# Patient Record
Sex: Female | Born: 2000 | Race: White | Hispanic: No | Marital: Single | Smoking: Never smoker
Health system: Southern US, Community
[De-identification: ages and names within clinical notes are randomized; demographics above are authoritative.]

## PROBLEM LIST (undated history)

## (undated) DIAGNOSIS — J45909 Unspecified asthma, uncomplicated: Secondary | ICD-10-CM

---
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
## 2018-02-16 DIAGNOSIS — J301 Allergic rhinitis due to pollen: Secondary | ICD-10-CM | POA: Diagnosis not present

## 2018-02-16 DIAGNOSIS — J3081 Allergic rhinitis due to animal (cat) (dog) hair and dander: Secondary | ICD-10-CM | POA: Diagnosis not present

## 2018-02-16 DIAGNOSIS — J3089 Other allergic rhinitis: Secondary | ICD-10-CM | POA: Diagnosis not present

## 2018-02-16 DIAGNOSIS — J4530 Mild persistent asthma, uncomplicated: Secondary | ICD-10-CM | POA: Diagnosis not present

## 2018-02-18 DIAGNOSIS — R350 Frequency of micturition: Secondary | ICD-10-CM | POA: Diagnosis not present

## 2018-07-15 DIAGNOSIS — Z23 Encounter for immunization: Secondary | ICD-10-CM | POA: Diagnosis not present

## 2018-07-28 DIAGNOSIS — J4530 Mild persistent asthma, uncomplicated: Secondary | ICD-10-CM | POA: Diagnosis not present

## 2018-07-28 DIAGNOSIS — J301 Allergic rhinitis due to pollen: Secondary | ICD-10-CM | POA: Diagnosis not present

## 2018-07-28 DIAGNOSIS — J3081 Allergic rhinitis due to animal (cat) (dog) hair and dander: Secondary | ICD-10-CM | POA: Diagnosis not present

## 2018-07-28 DIAGNOSIS — J3089 Other allergic rhinitis: Secondary | ICD-10-CM | POA: Diagnosis not present

## 2018-10-05 DIAGNOSIS — R3915 Urgency of urination: Secondary | ICD-10-CM | POA: Diagnosis not present

## 2019-02-23 DIAGNOSIS — Z20828 Contact with and (suspected) exposure to other viral communicable diseases: Secondary | ICD-10-CM | POA: Diagnosis not present

## 2019-02-23 DIAGNOSIS — Z1159 Encounter for screening for other viral diseases: Secondary | ICD-10-CM | POA: Diagnosis not present

## 2019-02-25 DIAGNOSIS — H6690 Otitis media, unspecified, unspecified ear: Secondary | ICD-10-CM | POA: Diagnosis not present

## 2019-05-23 DIAGNOSIS — Z0000 Encounter for general adult medical examination without abnormal findings: Secondary | ICD-10-CM | POA: Diagnosis not present

## 2019-06-13 DIAGNOSIS — Z03818 Encounter for observation for suspected exposure to other biological agents ruled out: Secondary | ICD-10-CM | POA: Diagnosis not present

## 2019-06-13 DIAGNOSIS — U071 COVID-19: Secondary | ICD-10-CM | POA: Diagnosis not present

## 2019-07-17 DIAGNOSIS — Z113 Encounter for screening for infections with a predominantly sexual mode of transmission: Secondary | ICD-10-CM | POA: Diagnosis not present

## 2019-07-17 DIAGNOSIS — Z3040 Encounter for surveillance of contraceptives, unspecified: Secondary | ICD-10-CM | POA: Diagnosis not present

## 2019-07-17 DIAGNOSIS — Z681 Body mass index (BMI) 19 or less, adult: Secondary | ICD-10-CM | POA: Diagnosis not present

## 2019-07-17 DIAGNOSIS — Z01419 Encounter for gynecological examination (general) (routine) without abnormal findings: Secondary | ICD-10-CM | POA: Diagnosis not present

## 2019-07-22 DIAGNOSIS — Z23 Encounter for immunization: Secondary | ICD-10-CM | POA: Diagnosis not present

## 2019-07-27 DIAGNOSIS — J301 Allergic rhinitis due to pollen: Secondary | ICD-10-CM | POA: Diagnosis not present

## 2019-07-27 DIAGNOSIS — J3081 Allergic rhinitis due to animal (cat) (dog) hair and dander: Secondary | ICD-10-CM | POA: Diagnosis not present

## 2019-07-27 DIAGNOSIS — J3089 Other allergic rhinitis: Secondary | ICD-10-CM | POA: Diagnosis not present

## 2019-07-27 DIAGNOSIS — J4530 Mild persistent asthma, uncomplicated: Secondary | ICD-10-CM | POA: Diagnosis not present

## 2019-10-13 DIAGNOSIS — Z20828 Contact with and (suspected) exposure to other viral communicable diseases: Secondary | ICD-10-CM | POA: Diagnosis not present

## 2019-10-13 DIAGNOSIS — Z03818 Encounter for observation for suspected exposure to other biological agents ruled out: Secondary | ICD-10-CM | POA: Diagnosis not present

## 2019-11-10 DIAGNOSIS — N760 Acute vaginitis: Secondary | ICD-10-CM | POA: Diagnosis not present

## 2019-12-13 DIAGNOSIS — Z30430 Encounter for insertion of intrauterine contraceptive device: Secondary | ICD-10-CM | POA: Diagnosis not present

## 2019-12-13 DIAGNOSIS — Z3202 Encounter for pregnancy test, result negative: Secondary | ICD-10-CM | POA: Diagnosis not present

## 2020-01-17 DIAGNOSIS — Z30431 Encounter for routine checking of intrauterine contraceptive device: Secondary | ICD-10-CM | POA: Diagnosis not present

## 2020-02-28 DIAGNOSIS — L700 Acne vulgaris: Secondary | ICD-10-CM | POA: Diagnosis not present

## 2020-04-05 DIAGNOSIS — Z03818 Encounter for observation for suspected exposure to other biological agents ruled out: Secondary | ICD-10-CM | POA: Diagnosis not present

## 2020-04-05 DIAGNOSIS — Z20822 Contact with and (suspected) exposure to covid-19: Secondary | ICD-10-CM | POA: Diagnosis not present

## 2020-04-29 DIAGNOSIS — F319 Bipolar disorder, unspecified: Secondary | ICD-10-CM | POA: Diagnosis not present

## 2020-05-06 DIAGNOSIS — H5213 Myopia, bilateral: Secondary | ICD-10-CM | POA: Diagnosis not present

## 2020-05-29 DIAGNOSIS — Z0000 Encounter for general adult medical examination without abnormal findings: Secondary | ICD-10-CM | POA: Diagnosis not present

## 2020-05-29 DIAGNOSIS — Z79899 Other long term (current) drug therapy: Secondary | ICD-10-CM | POA: Diagnosis not present

## 2020-05-30 DIAGNOSIS — Z5181 Encounter for therapeutic drug level monitoring: Secondary | ICD-10-CM | POA: Diagnosis not present

## 2020-05-30 DIAGNOSIS — L700 Acne vulgaris: Secondary | ICD-10-CM | POA: Diagnosis not present

## 2020-07-31 DIAGNOSIS — J3089 Other allergic rhinitis: Secondary | ICD-10-CM | POA: Diagnosis not present

## 2020-07-31 DIAGNOSIS — J3081 Allergic rhinitis due to animal (cat) (dog) hair and dander: Secondary | ICD-10-CM | POA: Diagnosis not present

## 2020-07-31 DIAGNOSIS — J4530 Mild persistent asthma, uncomplicated: Secondary | ICD-10-CM | POA: Diagnosis not present

## 2020-07-31 DIAGNOSIS — J301 Allergic rhinitis due to pollen: Secondary | ICD-10-CM | POA: Diagnosis not present

## 2020-08-03 DIAGNOSIS — Z23 Encounter for immunization: Secondary | ICD-10-CM | POA: Diagnosis not present

## 2020-09-10 DIAGNOSIS — N898 Other specified noninflammatory disorders of vagina: Secondary | ICD-10-CM | POA: Diagnosis not present

## 2020-09-10 DIAGNOSIS — N760 Acute vaginitis: Secondary | ICD-10-CM | POA: Diagnosis not present

## 2020-10-17 DIAGNOSIS — U071 COVID-19: Secondary | ICD-10-CM | POA: Diagnosis not present

## 2020-10-17 DIAGNOSIS — R059 Cough, unspecified: Secondary | ICD-10-CM | POA: Diagnosis not present

## 2020-11-04 DIAGNOSIS — J45909 Unspecified asthma, uncomplicated: Secondary | ICD-10-CM | POA: Diagnosis not present

## 2020-11-04 DIAGNOSIS — R053 Chronic cough: Secondary | ICD-10-CM | POA: Diagnosis not present

## 2020-11-04 DIAGNOSIS — U099 Post covid-19 condition, unspecified: Secondary | ICD-10-CM | POA: Diagnosis not present

## 2020-11-10 DIAGNOSIS — U099 Post covid-19 condition, unspecified: Secondary | ICD-10-CM | POA: Diagnosis not present

## 2020-11-10 DIAGNOSIS — J208 Acute bronchitis due to other specified organisms: Secondary | ICD-10-CM | POA: Diagnosis not present

## 2020-11-19 DIAGNOSIS — J45909 Unspecified asthma, uncomplicated: Secondary | ICD-10-CM | POA: Diagnosis not present

## 2020-11-19 DIAGNOSIS — U099 Post covid-19 condition, unspecified: Secondary | ICD-10-CM | POA: Diagnosis not present

## 2020-11-29 DIAGNOSIS — J029 Acute pharyngitis, unspecified: Secondary | ICD-10-CM | POA: Diagnosis not present

## 2020-11-29 DIAGNOSIS — R0981 Nasal congestion: Secondary | ICD-10-CM | POA: Diagnosis not present

## 2020-11-29 DIAGNOSIS — Z8709 Personal history of other diseases of the respiratory system: Secondary | ICD-10-CM | POA: Diagnosis not present

## 2020-12-05 DIAGNOSIS — N760 Acute vaginitis: Secondary | ICD-10-CM | POA: Diagnosis not present

## 2020-12-12 ENCOUNTER — Ambulatory Visit
Admission: RE | Admit: 2020-12-12 | Discharge: 2020-12-12 | Disposition: A | Discharge status: Home / Self Care | Payer: BLUE CROSS/BLUE SHIELD | Source: Ambulatory Visit | Attending: Family Medicine | Admitting: Family Medicine

## 2020-12-12 ENCOUNTER — Other Ambulatory Visit: Payer: Self-pay | Admitting: Family Medicine

## 2020-12-12 DIAGNOSIS — R059 Cough, unspecified: Secondary | ICD-10-CM

## 2021-02-21 DIAGNOSIS — R053 Chronic cough: Secondary | ICD-10-CM | POA: Diagnosis not present

## 2021-02-21 DIAGNOSIS — J301 Allergic rhinitis due to pollen: Secondary | ICD-10-CM | POA: Diagnosis not present

## 2021-02-21 DIAGNOSIS — J3089 Other allergic rhinitis: Secondary | ICD-10-CM | POA: Diagnosis not present

## 2021-02-21 DIAGNOSIS — J4530 Mild persistent asthma, uncomplicated: Secondary | ICD-10-CM | POA: Diagnosis not present

## 2021-02-26 DIAGNOSIS — J45909 Unspecified asthma, uncomplicated: Secondary | ICD-10-CM | POA: Diagnosis not present

## 2021-03-12 DIAGNOSIS — F419 Anxiety disorder, unspecified: Secondary | ICD-10-CM | POA: Diagnosis not present

## 2021-03-20 DIAGNOSIS — F419 Anxiety disorder, unspecified: Secondary | ICD-10-CM | POA: Diagnosis not present

## 2021-03-26 DIAGNOSIS — F419 Anxiety disorder, unspecified: Secondary | ICD-10-CM | POA: Diagnosis not present

## 2021-04-07 DIAGNOSIS — N760 Acute vaginitis: Secondary | ICD-10-CM | POA: Diagnosis not present

## 2021-04-07 DIAGNOSIS — Z681 Body mass index (BMI) 19 or less, adult: Secondary | ICD-10-CM | POA: Diagnosis not present

## 2021-04-07 DIAGNOSIS — Z124 Encounter for screening for malignant neoplasm of cervix: Secondary | ICD-10-CM | POA: Diagnosis not present

## 2021-04-16 DIAGNOSIS — F419 Anxiety disorder, unspecified: Secondary | ICD-10-CM | POA: Diagnosis not present

## 2021-05-09 DIAGNOSIS — Z681 Body mass index (BMI) 19 or less, adult: Secondary | ICD-10-CM | POA: Diagnosis not present

## 2021-05-09 DIAGNOSIS — N762 Acute vulvitis: Secondary | ICD-10-CM | POA: Diagnosis not present

## 2021-05-09 DIAGNOSIS — Z113 Encounter for screening for infections with a predominantly sexual mode of transmission: Secondary | ICD-10-CM | POA: Diagnosis not present

## 2021-05-26 DIAGNOSIS — J00 Acute nasopharyngitis [common cold]: Secondary | ICD-10-CM | POA: Diagnosis not present

## 2021-06-18 DIAGNOSIS — Z23 Encounter for immunization: Secondary | ICD-10-CM | POA: Diagnosis not present

## 2021-06-18 DIAGNOSIS — Z0000 Encounter for general adult medical examination without abnormal findings: Secondary | ICD-10-CM | POA: Diagnosis not present

## 2021-09-16 DIAGNOSIS — R509 Fever, unspecified: Secondary | ICD-10-CM | POA: Diagnosis not present

## 2021-09-16 DIAGNOSIS — Z03818 Encounter for observation for suspected exposure to other biological agents ruled out: Secondary | ICD-10-CM | POA: Diagnosis not present

## 2021-09-16 DIAGNOSIS — R059 Cough, unspecified: Secondary | ICD-10-CM | POA: Diagnosis not present

## 2021-10-01 DIAGNOSIS — Z5181 Encounter for therapeutic drug level monitoring: Secondary | ICD-10-CM | POA: Diagnosis not present

## 2021-10-01 DIAGNOSIS — L700 Acne vulgaris: Secondary | ICD-10-CM | POA: Diagnosis not present

## 2021-10-01 DIAGNOSIS — L810 Postinflammatory hyperpigmentation: Secondary | ICD-10-CM | POA: Diagnosis not present

## 2021-10-07 ENCOUNTER — Ambulatory Visit: Payer: BC Managed Care – PPO | Admitting: Allergy and Immunology

## 2021-10-19 ENCOUNTER — Emergency Department (HOSPITAL_BASED_OUTPATIENT_CLINIC_OR_DEPARTMENT_OTHER): Payer: BC Managed Care – PPO

## 2021-10-19 ENCOUNTER — Other Ambulatory Visit: Payer: Self-pay

## 2021-10-19 ENCOUNTER — Emergency Department (HOSPITAL_BASED_OUTPATIENT_CLINIC_OR_DEPARTMENT_OTHER)
Admission: EM | Admit: 2021-10-19 | Discharge: 2021-10-20 | Disposition: A | Discharge status: Home / Self Care | Payer: BC Managed Care – PPO | Attending: Emergency Medicine | Admitting: Emergency Medicine

## 2021-10-19 ENCOUNTER — Encounter (HOSPITAL_BASED_OUTPATIENT_CLINIC_OR_DEPARTMENT_OTHER): Payer: Self-pay | Admitting: Obstetrics and Gynecology

## 2021-10-19 DIAGNOSIS — N83519 Torsion of ovary and ovarian pedicle, unspecified side: Secondary | ICD-10-CM

## 2021-10-19 DIAGNOSIS — R109 Unspecified abdominal pain: Secondary | ICD-10-CM

## 2021-10-19 DIAGNOSIS — J45909 Unspecified asthma, uncomplicated: Secondary | ICD-10-CM | POA: Insufficient documentation

## 2021-10-19 DIAGNOSIS — R1030 Lower abdominal pain, unspecified: Secondary | ICD-10-CM | POA: Diagnosis not present

## 2021-10-19 DIAGNOSIS — R1032 Left lower quadrant pain: Secondary | ICD-10-CM | POA: Diagnosis not present

## 2021-10-19 DIAGNOSIS — N83202 Unspecified ovarian cyst, left side: Secondary | ICD-10-CM | POA: Diagnosis not present

## 2021-10-19 DIAGNOSIS — N83512 Torsion of left ovary and ovarian pedicle: Secondary | ICD-10-CM | POA: Insufficient documentation

## 2021-10-19 DIAGNOSIS — Z20822 Contact with and (suspected) exposure to covid-19: Secondary | ICD-10-CM | POA: Insufficient documentation

## 2021-10-19 DIAGNOSIS — K5900 Constipation, unspecified: Secondary | ICD-10-CM | POA: Diagnosis not present

## 2021-10-19 DIAGNOSIS — R102 Pelvic and perineal pain: Secondary | ICD-10-CM | POA: Diagnosis not present

## 2021-10-19 DIAGNOSIS — N8302 Follicular cyst of left ovary: Secondary | ICD-10-CM | POA: Diagnosis not present

## 2021-10-19 HISTORY — DX: Unspecified asthma, uncomplicated: J45.909

## 2021-10-19 LAB — URINALYSIS, ROUTINE W REFLEX MICROSCOPIC
Bilirubin Urine: NEGATIVE
Glucose, UA: NEGATIVE mg/dL
Hgb urine dipstick: NEGATIVE
Ketones, ur: NEGATIVE mg/dL
Leukocytes,Ua: NEGATIVE
Nitrite: NEGATIVE
Protein, ur: NEGATIVE mg/dL
Specific Gravity, Urine: 1.021 (ref 1.005–1.030)
pH: 8 (ref 5.0–8.0)

## 2021-10-19 LAB — COMPREHENSIVE METABOLIC PANEL
ALT: 9 U/L (ref 0–44)
AST: 14 U/L — ABNORMAL LOW (ref 15–41)
Albumin: 4.3 g/dL (ref 3.5–5.0)
Alkaline Phosphatase: 43 U/L (ref 38–126)
Anion gap: 8 (ref 5–15)
BUN: 9 mg/dL (ref 6–20)
CO2: 25 mmol/L (ref 22–32)
Calcium: 9.3 mg/dL (ref 8.9–10.3)
Chloride: 107 mmol/L (ref 98–111)
Creatinine, Ser: 0.67 mg/dL (ref 0.44–1.00)
GFR, Estimated: >60 mL/min (ref >60)
Glucose, Bld: 85 mg/dL (ref 70–99)
Potassium: 3.6 mmol/L (ref 3.5–5.1)
Sodium: 140 mmol/L (ref 135–145)
Total Bilirubin: 1.6 mg/dL — ABNORMAL HIGH (ref 0.3–1.2)
Total Protein: 6.9 g/dL (ref 6.5–8.1)

## 2021-10-19 LAB — CBC
HCT: 40.6 % (ref 36.0–46.0)
Hemoglobin: 13.5 g/dL (ref 12.0–15.0)
MCH: 29 pg (ref 26.0–34.0)
MCHC: 33.3 g/dL (ref 30.0–36.0)
MCV: 87.3 fL (ref 80.0–100.0)
Platelets: 146 10*3/uL — ABNORMAL LOW (ref 150–400)
RBC: 4.65 MIL/uL (ref 3.87–5.11)
RDW: 13.6 % (ref 11.5–15.5)
WBC: 6.2 10*3/uL (ref 4.0–10.5)
nRBC: 0 % (ref 0.0–0.2)

## 2021-10-19 LAB — PREGNANCY, URINE: Preg Test, Ur: NEGATIVE

## 2021-10-19 LAB — LIPASE, BLOOD: Lipase: 13 U/L (ref 11–51)

## 2021-10-19 NOTE — ED Triage Notes (Signed)
Patient reports to the ER for abdominal pain x3 days. Reports it is mostly on the left side and shoots around to the flank. Patient reports she has an IUD. Denies N/V/D.

## 2021-10-19 NOTE — ED Notes (Signed)
Patient transported to CT via wheelchair with tech

## 2021-10-19 NOTE — ED Provider Notes (Signed)
[facility] Provider Note   CSN: [id number] Arrival date & time: 10/19/21  1901     History  Chief Complaint  Patient presents with   Abdominal Pain    Diana Friedman is a 21 y.o. female.  HPI     Lower abdominal pain radiating to the back/left flank Cramping stabbing pain, then dull then returns Been there since Thursday Two episodes was extremely painful and was dizzy Not realted to position, nothing makes it better or worse No nausea or vomiting No fevers, urinary symptoms No diarrhea or constipation No vaginal bleeding or discharge No concern for STI, sexually active, using condoms most of the time  Grandfather with kidney stones  No hx of ovarian cysts  Past Medical History:  Diagnosis Date   Asthma     History reviewed. No pertinent surgical history.   Home Medications Prior to Admission medications   Not on File      Allergies    Patient has no known allergies.    Review of Systems   Review of Systems SEE ABOVE  Physical Exam Updated Vital Signs BP 109/70    Pulse 83    Temp 98.8 F (37.1 C)    Resp 17    Ht 5\' 5"  (1.651 m)    Wt 52.2 kg    SpO2 100%    BMI 19.14 kg/m  Physical Exam Vitals and nursing note reviewed.  Constitutional:      General: She is not in acute distress.    Appearance: She is well-developed. She is not diaphoretic.  HENT:     Head: Normocephalic and atraumatic.  Eyes:     Conjunctiva/sclera: Conjunctivae normal.  Cardiovascular:     Rate and Rhythm: Normal rate and regular rhythm.     Heart sounds: Normal heart sounds. No murmur heard.   No friction rub. No gallop.  Pulmonary:     Effort: Pulmonary effort is normal. No respiratory distress.     Breath sounds: Normal breath sounds. No wheezing or rales.  Abdominal:     General: There is no distension.     Palpations: Abdomen is soft.     Tenderness: There is no abdominal tenderness. There is no guarding.  Genitourinary:    Cervix: No  cervical motion tenderness or friability. Discharge: scant.    Uterus: Not enlarged and not tender.      Adnexa:        Right: No tenderness.         Left: Tenderness present.   Musculoskeletal:        General: No tenderness.     Cervical back: Normal range of motion.  Skin:    General: Skin is warm and dry.     Findings: No erythema or rash.  Neurological:     Mental Status: She is alert and oriented to person, place, and time.    ED Results / Procedures / Treatments   Labs (all labs ordered are listed, but only abnormal results are displayed) Labs Reviewed  COMPREHENSIVE METABOLIC PANEL - Abnormal; Notable for the following components:      Result Value   AST 14 (*)    Total Bilirubin 1.6 (*)    All other components within normal limits  CBC - Abnormal; Notable for the following components:   Platelets 146 (*)    All other components within normal limits  URINALYSIS, ROUTINE W REFLEX MICROSCOPIC - Abnormal; Notable for the following components:   APPearance CLOUDY (*)  Bacteria, UA RARE (*)    All other components within normal limits  WET PREP, GENITAL  RESP PANEL BY RT-PCR (FLU A&B, COVID) ARPGX2  LIPASE, BLOOD  PREGNANCY, URINE  GC/CHLAMYDIA PROBE AMP ([place]) NOT AT [facility]    EKG None  Radiology CT Renal Stone Study  Result Date: 10/19/2021 CLINICAL DATA:  Flank pain.  Concern for kidney stone. EXAM: CT ABDOMEN AND PELVIS WITHOUT CONTRAST TECHNIQUE: Multidetector CT imaging of the abdomen and pelvis was performed following the standard protocol without IV contrast. RADIATION DOSE REDUCTION: This exam was performed according to the departmental dose-optimization program which includes automated exposure control, adjustment of the mA and/or kV according to patient size and/or use of iterative reconstruction technique. COMPARISON:  None. FINDINGS: Evaluation of this exam is limited in the absence of intravenous contrast. Lower chest: The visualized lung bases are  clear. No intra-abdominal free air.  Small free fluid in the pelvis. Hepatobiliary: No focal liver abnormality is seen. No gallstones, gallbladder wall thickening, or biliary dilatation. Pancreas: Unremarkable. No pancreatic ductal dilatation or surrounding inflammatory changes. Spleen: Normal in size without focal abnormality. Adrenals/Urinary Tract: The adrenal glands are unremarkable. The kidneys, visualized ureters, and urinary bladder appear unremarkable. Stomach/Bowel: Moderate stool throughout the colon. No bowel obstruction or active inflammation. No evidence of acute appendicitis. Vascular/Lymphatic: The abdominal aorta and IVC are grossly unremarkable on this noncontrast CT. No portal venous gas. There is no adenopathy. Reproductive: The uterus is anteverted. An intrauterine device is noted. Ill-defined multi-septated cystic structure in the posterior pelvis measuring approximately 6 x 8 cm. This may represent a complex ovarian cyst. However, a pelvic inflammatory disease or tubo-ovarian abscess is not entirely excluded. Clinical correlation and further evaluation with pelvic ultrasound recommended. Other: None Musculoskeletal: No acute or significant osseous findings. IMPRESSION: 1. No hydronephrosis or nephrolithiasis. 2. Ill-defined multi-septated cystic structure in the posterior pelvis. Further evaluation with pelvic ultrasound recommended. 3. Constipation.  No bowel obstruction. Electronically Signed   By: Elgie Collard M.D.   On: 10/19/2021 22:54   US PELVIC COMPLETE W TRANSVAGINAL AND TORSION R/O  Result Date: 10/20/2021 CLINICAL DATA:  Left lower quadrant abdominal pain and midline back pain x4 days intermittently. LMP 07/29/2021 EXAM: TRANSABDOMINAL AND TRANSVAGINAL ULTRASOUND OF PELVIS DOPPLER ULTRASOUND OF OVARIES TECHNIQUE: Both transabdominal and transvaginal ultrasound examinations of the pelvis were performed. Transabdominal technique was performed for global imaging of the pelvis  including uterus, ovaries, adnexal regions, and pelvic cul-de-sac. It was necessary to proceed with endovaginal exam following the transabdominal exam to visualize the ovaries bilaterally. Color and duplex Doppler ultrasound was utilized to evaluate blood flow to the ovaries. COMPARISON:  None. FINDINGS: Uterus Measurements: 6.6 x 2.8 x 3.5 cm = volume: 34 mL. No fibroids or other mass visualized. Cervix unremarkable. Endometrium Thickness: 5 mm. Intrauterine device seen within expected position within the endometrial cavity. Right ovary Measurements: 3.9 x 2.2 x 2.8 cm = volume: 13 mL. Normal appearance/no adnexal mass. Left ovary Measurements: 7.5 x 4.4 x 6.0 cm = volume: 106 mL. The left ovary is asymmetrically enlarged and demonstrates a heterogeneous stromal echogenicity with areas of scattered hypoechogenicity throughout. Multiple enlarged follicular cysts are identified within the left ovary with at least 1 demonstrating a fluid fluid level in keeping with a hemorrhagic cyst. A small amount of simple appearing free fluid surrounds the left ovary. The left ovary, however, demonstrates normal color flow vascularity. Duplex sonography demonstrates normal low resistance arterial and venous waveforms, with a venous waveform best  visualized on image # 121. Pulsed Doppler evaluation of both ovaries demonstrates normal low-resistance arterial and venous waveforms. Other findings Trace simple appearing free fluid within the cul-de-sac. IMPRESSION: Enlarged, edematous appearing left ovary with numerous enlarged follicles and mild surrounding free fluid suspicious for a partially or intermittently torsed ovary. At this time, there is normal arterial and venous vascularity of the left ovary. Intrauterine device in expected position. Electronically Signed   By: Helyn Numbers M.D.   On: 10/20/2021 00:12    Procedures Procedures    Medications Ordered in ED Medications - No data to display  ED Course/ Medical  Decision Making/ A&P                           Medical Decision Making Amount and/or Complexity of Data Reviewed Labs: ordered. Radiology: ordered. ECG/medicine tests: ordered.   20yo female with history of asthma presents with concern for intermittent left lower abdominal pain.  DDx includes appendicitis, pancreatitis, cholecystitis, pyelonephritis, nephrolithiasis, diverticulitis, PID, ovarian torsion, ectopic pregnancy, and tuboovarian abscess.  CT stone study completed shows ill-defined multiseptated cystic structure in the posterior pelvis.  Transvaginal ultrasound was performed which shows edematous and enlarged left ovary with numerous large follicles and mild surrounding free fluid suspicious for partially or intermittently torsed ovary, however at this time there is normal arterial and venous vascularity.    Her pain has been intermittent and severe, and I do feel her clinical history is consistent with intermittent ovarian torsion with her US findings.  Discussed with Dr. Despina Hidden.  Reevaluated patient who reports some continuing pain.  Discussed with Radiology and Dr. Despina Hidden. Feel she requires OBGYN evaluation tonight for given torsion concerns.  Dr. Despina Hidden accepting patient--plan to go to [facility] ED by private vehicle, if possible may be seen in [facility].  Discussed with patient and her father at bedside.          Final Clinical Impression(s) / ED Diagnoses Final diagnoses:  Pelvic pain  Left sided abdominal pain  Ovarian torsion    Rx / DC Orders ED Discharge Orders     None         Alvira Monday, MD 10/20/21 9629

## 2021-10-20 ENCOUNTER — Other Ambulatory Visit: Payer: Self-pay | Admitting: Obstetrics & Gynecology

## 2021-10-20 DIAGNOSIS — N83512 Torsion of left ovary and ovarian pedicle: Secondary | ICD-10-CM | POA: Diagnosis not present

## 2021-10-20 DIAGNOSIS — J45909 Unspecified asthma, uncomplicated: Secondary | ICD-10-CM | POA: Diagnosis not present

## 2021-10-20 DIAGNOSIS — R109 Unspecified abdominal pain: Secondary | ICD-10-CM | POA: Insufficient documentation

## 2021-10-20 DIAGNOSIS — R1030 Lower abdominal pain, unspecified: Secondary | ICD-10-CM | POA: Diagnosis not present

## 2021-10-20 DIAGNOSIS — N83202 Unspecified ovarian cyst, left side: Secondary | ICD-10-CM

## 2021-10-20 DIAGNOSIS — R102 Pelvic and perineal pain: Secondary | ICD-10-CM | POA: Diagnosis not present

## 2021-10-20 DIAGNOSIS — Z20822 Contact with and (suspected) exposure to covid-19: Secondary | ICD-10-CM | POA: Diagnosis not present

## 2021-10-20 LAB — RESP PANEL BY RT-PCR (FLU A&B, COVID) ARPGX2
Influenza A by PCR: NEGATIVE
Influenza B by PCR: NEGATIVE
SARS Coronavirus 2 by RT PCR: NEGATIVE

## 2021-10-20 LAB — WET PREP, GENITAL
Clue Cells Wet Prep HPF POC: NONE SEEN
Sperm: NONE SEEN
Trich, Wet Prep: NONE SEEN
WBC, Wet Prep HPF POC: <10 (ref <10)
Yeast Wet Prep HPF POC: NONE SEEN

## 2021-10-20 MED ORDER — OXYCODONE-ACETAMINOPHEN 5-325 MG PO TABS
1.0000 | ORAL_TABLET | Freq: Once | ORAL | Status: AC
  Administered 2021-10-20: 1 via ORAL
  Filled 2021-10-20: qty 1

## 2021-10-20 MED ORDER — KETOROLAC TROMETHAMINE 10 MG PO TABS: 10.0000 mg | ORAL_TABLET | Freq: Three times a day (TID) | ORAL | 0 refills | Status: DC | PRN

## 2021-10-20 MED ORDER — OXYCODONE-ACETAMINOPHEN 5-325 MG PO TABS: 1.0000 | ORAL_TABLET | ORAL | 0 refills | Status: DC | PRN

## 2021-10-20 MED ORDER — DROSPIRENONE-ESTETROL 3-14.2 MG PO TABS: 1.0000 | ORAL_TABLET | Freq: Every day | ORAL | 12 refills | Status: DC

## 2021-10-20 NOTE — ED Notes (Signed)
Pt declined pain medication prior to transfer

## 2021-10-20 NOTE — Progress Notes (Signed)
Patient ID: Diana Friedman, female   DOB: 26-Mar-2001, 21 y.o.   MRN: [id number]    Chief Complaint  Patient presents with   Abdominal Pain      20 y.o. G0P0 No LMP recorded. (Menstrual status: IUD). The current method of family planning is IUD.  Patient is seen at the request of Dr Dalene SeltzerSchlossman, [facility] Specifically Dr Dalene SeltzerSchlossman was concerned about the possibility of ovarian torsion The patient was in usual state of good health until Thursday 10/16/21 when she began to have LLQ pain, that day it was sharp.  The pain improved over the nex t 2 days, she describes it as crampy, until pm 10/18/21 when it became sharp again.  Over the next 24 hours until presentation to [facility], the pain was intermittent and patient describes sharp stabbing.  No emesis or nausea No fever No bleeding No pain with recent intercourse She has an IUD in place Interestingly shortly after presentation her pain spontaneously improved again becoming more crampy CT stone study was negative for stone but revealed 6x8 cm pelvic mass Sonogram was ordered and confirmed that to be the left ovary  I reviewed the sonogram myself and with the patient and her mother.  She does have an enlarged left ovary with a couple of dominant cysts anf they are more centrally located not peripherally located as is the case with torsion.  The Doppler waveforms are absolutely normal both arterial and venous of both the proximal and lateral ovarian blood supply. There is no evidence of vascular compromise given the waveforms.  Likewise, the vascular flow of the vessels themselves has no helical pattern-->negative whirlpool sign.  Finally on exam the patient is completely benign really non tender throughout the exam, certainly no rebound.  Pt is in no distress at all.  Very pleasant and interactive.   Past Medical History:  Diagnosis Date   Asthma     History reviewed. No pertinent surgical history.  OB History    No obstetric history on file.     Allergies  Allergen Reactions   Pineapple     Stomach pain    Social History   Socioeconomic History   Marital status: Single    Spouse name: Not on file   Number of children: Not on file   Years of education: Not on file   Highest education level: Not on file  Occupational History   Not on file  Tobacco Use   Smoking status: Never    Passive exposure: Never   Smokeless tobacco: Never  Vaping Use   Vaping Use: Former   Substances: Nicotine, Flavoring  Substance and Sexual Activity   Alcohol use: Yes    Comment: Social   Drug use: Not Currently   Sexual activity: Yes    Birth control/protection: I.U.D.  Other Topics Concern   Not on file  Social History Narrative   Not on file   Social Determinants of Health   Financial Resource Strain: Not on file  Food Insecurity: Not on file  Transportation Needs: Not on file  Physical Activity: Not on file  Stress: Not on file  Social Connections: Not on file    No family history on file.  Medications:      No current facility-administered medications for this encounter.  Current Outpatient Medications:    acetaminophen (TYLENOL) 500 MG tablet, Take 1,000 mg by mouth every 6 (six) hours as needed for moderate pain or headache., Disp: , Rfl:  albuterol (VENTOLIN HFA) 108 (90 Base) MCG/ACT inhaler, Inhale 2 puffs into the lungs every 4 (four) hours as needed for shortness of breath or wheezing., Disp: , Rfl:    fluticasone (FLONASE) 50 MCG/ACT nasal spray, Place 1 spray into both nostrils daily as needed for allergies or rhinitis., Disp: , Rfl:    Fluticasone-Umeclidin-Vilant (TRELEGY ELLIPTA IN), Inhale 2 puffs into the lungs daily., Disp: , Rfl:    ibuprofen (ADVIL) 200 MG tablet, Take 600 mg by mouth every 6 (six) hours as needed for headache or moderate pain., Disp: , Rfl:    pseudoephedrine (SUDAFED) 60 MG tablet, Take 60 mg by mouth every 4 (four) hours as needed for congestion.,  Disp: , Rfl:    spironolactone (ALDACTONE) 100 MG tablet, Take 100 mg by mouth at bedtime., Disp: , Rfl:    oseltamivir (TAMIFLU) 75 MG capsule, Take 75 mg by mouth See admin instructions. Bid x 5 days (Patient not taking: Reported on 10/20/2021), Disp: , Rfl:   Objective Blood pressure 111/72, pulse 85, temperature 98.4 F (36.9 C), temperature source Oral, resp. rate 14, height 5\' 5"  (1.651 m), weight 52.2 kg, SpO2 100 %.  Gen thin female NAD Abdomen is scapphoid benign non tender no masses no rebound  Pertinent ROS Per HPI No burning with urination, frequency or urgency No nausea, vomiting or diarrhea Nor fever chills or other constitutional symptoms   Labs or studies  CBC    Component Value Date/Time   WBC 6.2 10/19/2021 1937   RBC 4.65 10/19/2021 1937   HGB 13.5 10/19/2021 1937   HCT 40.6 10/19/2021 1937   PLT 146 (L) 10/19/2021 1937   MCV 87.3 10/19/2021 1937   MCH 29.0 10/19/2021 1937   MCHC 33.3 10/19/2021 1937   RDW 13.6 10/19/2021 1937   CMP Latest Ref Rng & Units 10/19/2021  Glucose 70 - 99 mg/dL 85  BUN 6 - 20 mg/dL 9  Creatinine 10/21/2021 - 5.64 mg/dL 3.32  Sodium 9.51 - 884 mmol/L 140  Potassium 3.5 - 5.1 mmol/L 3.6  Chloride 98 - 111 mmol/L 107  CO2 22 - 32 mmol/L 25  Calcium 8.9 - 10.3 mg/dL 9.3  Total Protein 6.5 - 8.1 g/dL 6.9  Total Bilirubin 0.3 - 1.2 mg/dL 166)  Alkaline Phos 38 - 126 U/L 43  AST 15 - 41 U/L 14(L)  ALT 0 - 44 U/L 9     CT Renal Stone Study  Result Date: 10/19/2021 CLINICAL DATA:  Flank pain.  Concern for kidney stone. EXAM: CT ABDOMEN AND PELVIS WITHOUT CONTRAST TECHNIQUE: Multidetector CT imaging of the abdomen and pelvis was performed following the standard protocol without IV contrast. RADIATION DOSE REDUCTION: This exam was performed according to the departmental dose-optimization program which includes automated exposure control, adjustment of the mA and/or kV according to patient size and/or use of iterative reconstruction  technique. COMPARISON:  None. FINDINGS: Evaluation of this exam is limited in the absence of intravenous contrast. Lower chest: The visualized lung bases are clear. No intra-abdominal free air.  Small free fluid in the pelvis. Hepatobiliary: No focal liver abnormality is seen. No gallstones, gallbladder wall thickening, or biliary dilatation. Pancreas: Unremarkable. No pancreatic ductal dilatation or surrounding inflammatory changes. Spleen: Normal in size without focal abnormality. Adrenals/Urinary Tract: The adrenal glands are unremarkable. The kidneys, visualized ureters, and urinary bladder appear unremarkable. Stomach/Bowel: Moderate stool throughout the colon. No bowel obstruction or active inflammation. No evidence of acute appendicitis. Vascular/Lymphatic: The abdominal aorta and IVC are grossly  unremarkable on this noncontrast CT. No portal venous gas. There is no adenopathy. Reproductive: The uterus is anteverted. An intrauterine device is noted. Ill-defined multi-septated cystic structure in the posterior pelvis measuring approximately 6 x 8 cm. This may represent a complex ovarian cyst. However, a pelvic inflammatory disease or tubo-ovarian abscess is not entirely excluded. Clinical correlation and further evaluation with pelvic ultrasound recommended. Other: None Musculoskeletal: No acute or significant osseous findings. IMPRESSION: 1. No hydronephrosis or nephrolithiasis. 2. Ill-defined multi-septated cystic structure in the posterior pelvis. Further evaluation with pelvic ultrasound recommended. 3. Constipation.  No bowel obstruction. Electronically Signed   By: Elgie Collard M.D.   On: 10/19/2021 22:54   US PELVIC COMPLETE W TRANSVAGINAL AND TORSION R/O  Result Date: 10/20/2021 CLINICAL DATA:  Left lower quadrant abdominal pain and midline back pain x4 days intermittently. LMP 07/29/2021 EXAM: TRANSABDOMINAL AND TRANSVAGINAL ULTRASOUND OF PELVIS DOPPLER ULTRASOUND OF OVARIES TECHNIQUE: Both  transabdominal and transvaginal ultrasound examinations of the pelvis were performed. Transabdominal technique was performed for global imaging of the pelvis including uterus, ovaries, adnexal regions, and pelvic cul-de-sac. It was necessary to proceed with endovaginal exam following the transabdominal exam to visualize the ovaries bilaterally. Color and duplex Doppler ultrasound was utilized to evaluate blood flow to the ovaries. COMPARISON:  None. FINDINGS: Uterus Measurements: 6.6 x 2.8 x 3.5 cm = volume: 34 mL. No fibroids or other mass visualized. Cervix unremarkable. Endometrium Thickness: 5 mm. Intrauterine device seen within expected position within the endometrial cavity. Right ovary Measurements: 3.9 x 2.2 x 2.8 cm = volume: 13 mL. Normal appearance/no adnexal mass. Left ovary Measurements: 7.5 x 4.4 x 6.0 cm = volume: 106 mL. The left ovary is asymmetrically enlarged and demonstrates a heterogeneous stromal echogenicity with areas of scattered hypoechogenicity throughout. Multiple enlarged follicular cysts are identified within the left ovary with at least 1 demonstrating a fluid fluid level in keeping with a hemorrhagic cyst. A small amount of simple appearing free fluid surrounds the left ovary. The left ovary, however, demonstrates normal color flow vascularity. Duplex sonography demonstrates normal low resistance arterial and venous waveforms, with a venous waveform best visualized on image # 121. Pulsed Doppler evaluation of both ovaries demonstrates normal low-resistance arterial and venous waveforms. Other findings Trace simple appearing free fluid within the cul-de-sac. IMPRESSION: Enlarged, edematous appearing left ovary with numerous enlarged follicles and mild surrounding free fluid suspicious for a partially or intermittently torsed ovary. At this time, there is normal arterial and venous vascularity of the left ovary. Intrauterine device in expected position. Electronically Signed   By:  Helyn Numbers M.D.   On: 10/20/2021 00:12       Impression Diagnoses this Encounter:: LLQ pain with left ovarian cysts, no current evidence of vascular compromise of the left adnexa or ovarian torsion  Established relevant diagnosis(es):   Plan/Recommendations: Meds ordered this encounter  Medications   oxyCODONE-acetaminophen (PERCOCET/ROXICET) 5-325 MG per tablet 1 tablet    Labs or Scans Ordered: Orders Placed This Encounter  Procedures   Wet prep, genital   Resp Panel by RT-PCR (Flu A&B, Covid) Nasopharyngeal Swab   CT Renal Stone Study   US PELVIC COMPLETE W TRANSVAGINAL AND TORSION R/O   Lipase, blood   Comprehensive metabolic panel   CBC   Urinalysis, Routine w reflex microscopic   Pregnancy, urine   Diet NPO time specified   Pelvic cart    Management:: I discussed the clinical exam and sonogram extensively with the patient and her mother and  showed them pertinent findings as well as the Doppler wave form and vascular images all of which are normal, both arterial and venous, proximal and lateral blood flow.  At present, I do not have an acute indication to proceed with surgery and patient and mother both agree  She ias a patient of Dr Waynard ReedsKendra Ross' and I will make her aware of this patient and her clinical situation  I am e prescribing oxycodone, toradol and nexstellis(for ovarian cyst suppression) and the patient will give Dr Tenny Crawoss a call later today to schedule follow up All questions were answered.  Pt understands her condition may improve, stay stable or worsena dn understands surgery is not ruled out in the coming days should her condition clinically warrant.  Follow up With Dr Tenny Crawoss, call office later today       All questions were answered.

## 2021-10-20 NOTE — Discharge Instructions (Addendum)
Please follow all instructions from Dr. Despina Hidden of OBGYN. Return to ER for any new or concerning symptoms.

## 2021-10-20 NOTE — ED Notes (Signed)
Patient verbalizes understanding of discharge instructions. Opportunity for questioning and answers were provided. Armband removed by staff, pt discharged from ED. Wheeled out to lobby  

## 2021-10-20 NOTE — ED Provider Notes (Signed)
Pt transferred from freestanding ER for possible torsion.  Please see prior provider note for more detail.   Briefly: Patient is 21 y.o.   DDX: concern for ovarian torsion  Plan: Follow-up on OB/GYN recommendations.    Physical Exam  BP 105/75 (BP Location: Left Arm)    Pulse 85    Temp 98.4 F (36.9 C) (Oral)    Resp 14    Ht 5\' 5"  (1.651 m)    Wt 52.2 kg    SpO2 100%    BMI 19.14 kg/m   Physical Exam  Procedures  Procedures Results for orders placed or performed during the hospital encounter of 10/19/21  Wet prep, genital   Specimen: Vaginal  Result Value Ref Range   Yeast Wet Prep HPF POC NONE SEEN NONE SEEN   Trich, Wet Prep NONE SEEN NONE SEEN   Clue Cells Wet Prep HPF POC NONE SEEN NONE SEEN   WBC, Wet Prep HPF POC <10 <10   Sperm NONE SEEN   Resp Panel by RT-PCR (Flu A&B, Covid) Nasopharyngeal Swab   Specimen: Nasopharyngeal Swab; Nasopharyngeal(NP) swabs in vial transport medium  Result Value Ref Range   SARS Coronavirus 2 by RT PCR NEGATIVE NEGATIVE   Influenza A by PCR NEGATIVE NEGATIVE   Influenza B by PCR NEGATIVE NEGATIVE  Lipase, blood  Result Value Ref Range   Lipase 13 11 - 51 U/L  Comprehensive metabolic panel  Result Value Ref Range   Sodium 140 135 - 145 mmol/L   Potassium 3.6 3.5 - 5.1 mmol/L   Chloride 107 98 - 111 mmol/L   CO2 25 22 - 32 mmol/L   Glucose, Bld 85 70 - 99 mg/dL   BUN 9 6 - 20 mg/dL   Creatinine, Ser 10/21/21 0.44 - 1.00 mg/dL   Calcium 9.3 8.9 - 4.48 mg/dL   Total Protein 6.9 6.5 - 8.1 g/dL   Albumin 4.3 3.5 - 5.0 g/dL   AST 14 (L) 15 - 41 U/L   ALT 9 0 - 44 U/L   Alkaline Phosphatase 43 38 - 126 U/L   Total Bilirubin 1.6 (H) 0.3 - 1.2 mg/dL   GFR, Estimated 18.5 >63 mL/min   Anion gap 8 5 - 15  CBC  Result Value Ref Range   WBC 6.2 4.0 - 10.5 K/uL   RBC 4.65 3.87 - 5.11 MIL/uL   Hemoglobin 13.5 12.0 - 15.0 g/dL   HCT >14 97.0 - 26.3 %   MCV 87.3 80.0 - 100.0 fL   MCH 29.0 26.0 - 34.0 pg   MCHC 33.3 30.0 - 36.0 g/dL    RDW 78.5 88.5 - 02.7 %   Platelets 146 (L) 150 - 400 K/uL   nRBC 0.0 0.0 - 0.2 %  Urinalysis, Routine w reflex microscopic  Result Value Ref Range   Color, Urine YELLOW YELLOW   APPearance CLOUDY (A) CLEAR   Specific Gravity, Urine 1.021 1.005 - 1.030   pH 8.0 5.0 - 8.0   Glucose, UA NEGATIVE NEGATIVE mg/dL   Hgb urine dipstick NEGATIVE NEGATIVE   Bilirubin Urine NEGATIVE NEGATIVE   Ketones, ur NEGATIVE NEGATIVE mg/dL   Protein, ur NEGATIVE NEGATIVE mg/dL   Nitrite NEGATIVE NEGATIVE   Leukocytes,Ua NEGATIVE NEGATIVE   RBC / HPF 0-5 0 - 5 RBC/hpf   WBC, UA 0-5 0 - 5 WBC/hpf   Bacteria, UA RARE (A) NONE SEEN   Squamous Epithelial / LPF 0-5 0 - 5   Budding Yeast PRESENT  Pregnancy, urine  Result Value Ref Range   Preg Test, Ur NEGATIVE NEGATIVE   CT Renal Stone Study  Result Date: 10/19/2021 CLINICAL DATA:  Flank pain.  Concern for kidney stone. EXAM: CT ABDOMEN AND PELVIS WITHOUT CONTRAST TECHNIQUE: Multidetector CT imaging of the abdomen and pelvis was performed following the standard protocol without IV contrast. RADIATION DOSE REDUCTION: This exam was performed according to the departmental dose-optimization program which includes automated exposure control, adjustment of the mA and/or kV according to patient size and/or use of iterative reconstruction technique. COMPARISON:  None. FINDINGS: Evaluation of this exam is limited in the absence of intravenous contrast. Lower chest: The visualized lung bases are clear. No intra-abdominal free air.  Small free fluid in the pelvis. Hepatobiliary: No focal liver abnormality is seen. No gallstones, gallbladder wall thickening, or biliary dilatation. Pancreas: Unremarkable. No pancreatic ductal dilatation or surrounding inflammatory changes. Spleen: Normal in size without focal abnormality. Adrenals/Urinary Tract: The adrenal glands are unremarkable. The kidneys, visualized ureters, and urinary bladder appear unremarkable. Stomach/Bowel: Moderate  stool throughout the colon. No bowel obstruction or active inflammation. No evidence of acute appendicitis. Vascular/Lymphatic: The abdominal aorta and IVC are grossly unremarkable on this noncontrast CT. No portal venous gas. There is no adenopathy. Reproductive: The uterus is anteverted. An intrauterine device is noted. Ill-defined multi-septated cystic structure in the posterior pelvis measuring approximately 6 x 8 cm. This may represent a complex ovarian cyst. However, a pelvic inflammatory disease or tubo-ovarian abscess is not entirely excluded. Clinical correlation and further evaluation with pelvic ultrasound recommended. Other: None Musculoskeletal: No acute or significant osseous findings. IMPRESSION: 1. No hydronephrosis or nephrolithiasis. 2. Ill-defined multi-septated cystic structure in the posterior pelvis. Further evaluation with pelvic ultrasound recommended. 3. Constipation.  No bowel obstruction. Electronically Signed   By: Elgie Collard M.D.   On: 10/19/2021 22:54   US PELVIC COMPLETE W TRANSVAGINAL AND TORSION R/O  Result Date: 10/20/2021 CLINICAL DATA:  Left lower quadrant abdominal pain and midline back pain x4 days intermittently. LMP 07/29/2021 EXAM: TRANSABDOMINAL AND TRANSVAGINAL ULTRASOUND OF PELVIS DOPPLER ULTRASOUND OF OVARIES TECHNIQUE: Both transabdominal and transvaginal ultrasound examinations of the pelvis were performed. Transabdominal technique was performed for global imaging of the pelvis including uterus, ovaries, adnexal regions, and pelvic cul-de-sac. It was necessary to proceed with endovaginal exam following the transabdominal exam to visualize the ovaries bilaterally. Color and duplex Doppler ultrasound was utilized to evaluate blood flow to the ovaries. COMPARISON:  None. FINDINGS: Uterus Measurements: 6.6 x 2.8 x 3.5 cm = volume: 34 mL. No fibroids or other mass visualized. Cervix unremarkable. Endometrium Thickness: 5 mm. Intrauterine device seen within expected  position within the endometrial cavity. Right ovary Measurements: 3.9 x 2.2 x 2.8 cm = volume: 13 mL. Normal appearance/no adnexal mass. Left ovary Measurements: 7.5 x 4.4 x 6.0 cm = volume: 106 mL. The left ovary is asymmetrically enlarged and demonstrates a heterogeneous stromal echogenicity with areas of scattered hypoechogenicity throughout. Multiple enlarged follicular cysts are identified within the left ovary with at least 1 demonstrating a fluid fluid level in keeping with a hemorrhagic cyst. A small amount of simple appearing free fluid surrounds the left ovary. The left ovary, however, demonstrates normal color flow vascularity. Duplex sonography demonstrates normal low resistance arterial and venous waveforms, with a venous waveform best visualized on image # 121. Pulsed Doppler evaluation of both ovaries demonstrates normal low-resistance arterial and venous waveforms. Other findings Trace simple appearing free fluid within the cul-de-sac. IMPRESSION: Enlarged, edematous appearing  left ovary with numerous enlarged follicles and mild surrounding free fluid suspicious for a partially or intermittently torsed ovary. At this time, there is normal arterial and venous vascularity of the left ovary. Intrauterine device in expected position. Electronically Signed   By: Helyn NumbersAshesh  Parikh M.D.   On: 10/20/2021 00:12    ED Course / MDM    Medical Decision Making Amount and/or Complexity of Data Reviewed Labs: ordered. Radiology: ordered. ECG/medicine tests: ordered.  Risk Prescription drug management.   Discussed with Dr. Despina HiddenEure as soon as she was placed in room.  She states pain is improved but is still uncomfortable. 3:08 AM  Patient diagnosed with intermittent torsion OB/GYN has assessed patient at bedside and need recommendations for follow-up.  He plans to discuss her case with the patient's OB/GYN who will see her expediently in the outpatient setting.  He also informs me that he is sending  something to her pharmacy for pain.  Given one dose of percocet prior to DC. DC-ed with mother.   This patient appears reasonably screened and I doubt any other medical condition requiring further workup, evaluation, or treatment in the ED at this time prior to discharge.   Patient's vitals are WNL apart from vital sign abnormalities discussed above, patient is in NAD, and able to ambulate in the ED at their baseline and able to tolerate PO.  Pain has been managed or a plan has been made for home management and has no complaints prior to discharge. Patient is comfortable with above plan and for discharge at this time. All questions were answered prior to disposition. Results from the ER workup discussed with the patient face to face and all questions answered to the best of my ability. The patient is safe for discharge with strict return precautions. Patient appears safe for discharge with appropriate follow-up. Conveyed my impression with the patient and they voiced understanding and are agreeable to plan.   An After Visit Summary was printed and given to the patient.  Portions of this note were generated with Scientist, clinical (histocompatibility and immunogenetics)Dragon dictation software. Dictation errors may occur despite best attempts at proofreading.      Solon AugustaFondaw, Brooks Kinnan [place], [place] 10/20/21 2245    Dione BoozeGlick, David, MD 10/21/21 [id number]

## 2021-10-20 NOTE — ED Provider Triage Note (Signed)
Emergency Medicine Provider Triage Evaluation Note  Diana Friedman , a 21 y.o. female  was evaluated in triage.  Pt complains of lower abdominal pain/left flank pain been going on since Thursday, was seen at her stand-alone ED, [facility] ultrasound was performed showing concerns of possible left ovarian torsion, provider spoke with Dr. Despina Hidden OB/GYN evaluation and sent to [facility] via POV.Marland Kitchen  Review of Systems  Positive: Stomach pain, pelvic pain Negative: Chest pain, shortness of breath  Physical Exam  BP 105/75 (BP Location: Left Arm)    Pulse 85    Temp 98.4 F (36.9 C) (Oral)    Resp 14    Ht 5\' 5"  (1.651 m)    Wt 52.2 kg    SpO2 100%    BMI 19.14 kg/m  Gen:   Awake, no distress   Resp:  Normal effort  MSK:   Moves extremities without difficulty  Other:    Medical Decision Making  Medically screening exam initiated at 2:01 AM.  Appropriate orders placed.  Diana Friedman was informed that the remainder of the evaluation will be completed by another provider, this initial triage assessment does not replace that evaluation, and the importance of remaining in the ED until their evaluation is complete.  Spoke with Dr. Caswell Corwin as well as the NP Despina Hidden of the MAU, since patient is not pregnant she cannot go to the MAU, recommend that she get a room immediately and upon rooming page Dr. Judeth Horn and he will come down and assessed the patient.     Despina Hidden, PA-C 10/20/21 0215

## 2021-10-21 LAB — GC/CHLAMYDIA PROBE AMP (~~LOC~~) NOT AT ARMC
Chlamydia: NEGATIVE
Comment: NEGATIVE
Comment: NORMAL
Neisseria Gonorrhea: NEGATIVE

## 2021-10-27 DIAGNOSIS — R1032 Left lower quadrant pain: Secondary | ICD-10-CM | POA: Diagnosis not present

## 2021-10-27 DIAGNOSIS — Z681 Body mass index (BMI) 19 or less, adult: Secondary | ICD-10-CM | POA: Diagnosis not present

## 2021-10-29 ENCOUNTER — Other Ambulatory Visit: Payer: Self-pay

## 2021-10-29 ENCOUNTER — Inpatient Hospital Stay (HOSPITAL_COMMUNITY)
Admission: AD | Admit: 2021-10-29 | Discharge: 2021-10-29 | Disposition: A | Discharge status: Home / Self Care | Payer: BC Managed Care – PPO | Attending: Obstetrics and Gynecology | Admitting: Obstetrics and Gynecology

## 2021-10-29 DIAGNOSIS — N83202 Unspecified ovarian cyst, left side: Secondary | ICD-10-CM | POA: Diagnosis not present

## 2021-10-29 DIAGNOSIS — Z681 Body mass index (BMI) 19 or less, adult: Secondary | ICD-10-CM | POA: Diagnosis not present

## 2021-10-29 DIAGNOSIS — Z3202 Encounter for pregnancy test, result negative: Secondary | ICD-10-CM | POA: Insufficient documentation

## 2021-10-29 DIAGNOSIS — R109 Unspecified abdominal pain: Secondary | ICD-10-CM

## 2021-10-29 DIAGNOSIS — R1032 Left lower quadrant pain: Secondary | ICD-10-CM | POA: Insufficient documentation

## 2021-10-29 LAB — POCT PREGNANCY, URINE: Preg Test, Ur: NEGATIVE

## 2021-10-29 NOTE — Discharge Instructions (Signed)
Return to care  If you have heavier bleeding that soaks through more than 2 pads per hour for an hour or more If you bleed so much that you feel like you might pass out or you do pass out If you have significant abdominal pain that is not improved with Tylenol   

## 2021-10-29 NOTE — MAU Provider Note (Signed)
Event Date/Time   First Provider Initiated Contact with Patient 10/29/21 0034      S Ms. Diana Friedman is a 21 y.o. No obstetric history on file. patient who presents to MAU today with complaint of LLQ abdominal pain. Was seen at [facility] last week & diagnosed with left ovarian cysts & possible intermittent ovarian torsion. She had follow up with Dr. Tenny Craw yesterday - started on birth control & pain medications with plan for f/u ultrasound in 6 weeks. States intense pain returned this evening around 5 pm. Took her medication & pain improved about 30 minutes prior to arrival. Denies fever, n/v/d, or vaginal bleeding.   O BP 127/71 (BP Location: Right Arm)    Pulse 79    Temp 98.5 F (36.9 C) (Oral)    Resp 18    Ht 5\' 5"  (1.651 m)    Wt 50.2 kg    SpO2 99%    BMI 18.42 kg/m  Physical Exam Vitals and nursing note reviewed.  Constitutional:      General: She is not in acute distress.    Appearance: She is well-developed.  HENT:     Head: Normocephalic and atraumatic.  Pulmonary:     Effort: Pulmonary effort is normal. No respiratory distress.  Abdominal:     General: Abdomen is flat.     Palpations: Abdomen is soft.     Tenderness: There is no abdominal tenderness. There is no guarding or rebound.  Neurological:     General: No focal deficit present.     Mental Status: She is alert.  Psychiatric:        Mood and Affect: Mood normal.        Behavior: Behavior normal.    A Medical screening exam complete 1. Abdominal pain in female   2. Negative pregnancy test      P Discharge from MAU in stable condition Patient given the option of transfer to [facility] for further evaluation or seek care in outpatient facility of choice - patient opts to call her gyn tomorrow morning & f/u in the office Warning signs for worsening condition that would warrant emergency follow-up discussed Patient will go to ED if symptoms return or worsen  [facility], NP 10/29/2021 12:34 AM

## 2021-10-29 NOTE — MAU Note (Signed)
Pt left before signing discharge instructions. 

## 2021-10-29 NOTE — MAU Note (Signed)
Pt presented to MAU for issues with ovaries. Pt states she has lower ABD pain that is mainly on her left side, but sometimes radiates to right side and back. Pt took POCT urine pregnancy in MAU. Test was Negative, pt is not pregnant. Pt triaged and MSE performed by Judeth Horn, NP. Pt states sees [facility] for GYN care.

## 2021-10-31 ENCOUNTER — Emergency Department (HOSPITAL_COMMUNITY): Payer: BC Managed Care – PPO

## 2021-10-31 ENCOUNTER — Encounter (HOSPITAL_COMMUNITY)
Admission: EM | Disposition: A | Discharge status: Home / Self Care | Payer: Self-pay | Source: Home / Self Care | Attending: Emergency Medicine

## 2021-10-31 ENCOUNTER — Ambulatory Visit (HOSPITAL_COMMUNITY)
Admission: EM | Admit: 2021-10-31 | Discharge: 2021-10-31 | Disposition: A | Discharge status: Home / Self Care | Payer: BC Managed Care – PPO | Attending: Obstetrics and Gynecology | Admitting: Obstetrics and Gynecology

## 2021-10-31 ENCOUNTER — Emergency Department (HOSPITAL_COMMUNITY): Payer: BC Managed Care – PPO | Admitting: Anesthesiology

## 2021-10-31 ENCOUNTER — Other Ambulatory Visit: Payer: Self-pay

## 2021-10-31 ENCOUNTER — Other Ambulatory Visit: Payer: Self-pay | Admitting: Obstetrics and Gynecology

## 2021-10-31 DIAGNOSIS — N200 Calculus of kidney: Secondary | ICD-10-CM | POA: Diagnosis not present

## 2021-10-31 DIAGNOSIS — N83202 Unspecified ovarian cyst, left side: Secondary | ICD-10-CM | POA: Diagnosis not present

## 2021-10-31 DIAGNOSIS — R102 Pelvic and perineal pain: Secondary | ICD-10-CM | POA: Diagnosis not present

## 2021-10-31 DIAGNOSIS — R1084 Generalized abdominal pain: Secondary | ICD-10-CM | POA: Diagnosis not present

## 2021-10-31 DIAGNOSIS — N83519 Torsion of ovary and ovarian pedicle, unspecified side: Secondary | ICD-10-CM

## 2021-10-31 DIAGNOSIS — Z20822 Contact with and (suspected) exposure to covid-19: Secondary | ICD-10-CM | POA: Diagnosis not present

## 2021-10-31 DIAGNOSIS — R1032 Left lower quadrant pain: Secondary | ICD-10-CM | POA: Diagnosis not present

## 2021-10-31 DIAGNOSIS — N83512 Torsion of left ovary and ovarian pedicle: Secondary | ICD-10-CM | POA: Insufficient documentation

## 2021-10-31 DIAGNOSIS — Z975 Presence of (intrauterine) contraceptive device: Secondary | ICD-10-CM | POA: Insufficient documentation

## 2021-10-31 HISTORY — PX: LAPAROSCOPY: SHX197

## 2021-10-31 LAB — COMPREHENSIVE METABOLIC PANEL
ALT: 14 U/L (ref 0–44)
AST: 20 U/L (ref 15–41)
Albumin: 4.6 g/dL (ref 3.5–5.0)
Alkaline Phosphatase: 44 U/L (ref 38–126)
Anion gap: 11 (ref 5–15)
BUN: 22 mg/dL — ABNORMAL HIGH (ref 6–20)
CO2: 17 mmol/L — ABNORMAL LOW (ref 22–32)
Calcium: 9.9 mg/dL (ref 8.9–10.3)
Chloride: 107 mmol/L (ref 98–111)
Creatinine, Ser: 0.84 mg/dL (ref 0.44–1.00)
GFR, Estimated: >60 mL/min (ref >60)
Glucose, Bld: 94 mg/dL (ref 70–99)
Potassium: 3.5 mmol/L (ref 3.5–5.1)
Sodium: 135 mmol/L (ref 135–145)
Total Bilirubin: 1.6 mg/dL — ABNORMAL HIGH (ref 0.3–1.2)
Total Protein: 7.7 g/dL (ref 6.5–8.1)

## 2021-10-31 LAB — CBC WITH DIFFERENTIAL/PLATELET
Abs Immature Granulocytes: 0.03 10*3/uL (ref 0.00–0.07)
Basophils Absolute: 0.1 10*3/uL (ref 0.0–0.1)
Basophils Relative: 1 %
Eosinophils Absolute: 0.3 10*3/uL (ref 0.0–0.5)
Eosinophils Relative: 3 %
HCT: 43.4 % (ref 36.0–46.0)
Hemoglobin: 14.6 g/dL (ref 12.0–15.0)
Immature Granulocytes: 0 %
Lymphocytes Relative: 24 %
Lymphs Abs: 2.1 10*3/uL (ref 0.7–4.0)
MCH: 29.1 pg (ref 26.0–34.0)
MCHC: 33.6 g/dL (ref 30.0–36.0)
MCV: 86.6 fL (ref 80.0–100.0)
Monocytes Absolute: 0.4 10*3/uL (ref 0.1–1.0)
Monocytes Relative: 4 %
Neutro Abs: 5.9 10*3/uL (ref 1.7–7.7)
Neutrophils Relative %: 68 %
Platelets: 165 10*3/uL (ref 150–400)
RBC: 5.01 MIL/uL (ref 3.87–5.11)
RDW: 12.9 % (ref 11.5–15.5)
WBC: 8.7 10*3/uL (ref 4.0–10.5)
nRBC: 0 % (ref 0.0–0.2)

## 2021-10-31 LAB — TYPE AND SCREEN
ABO/RH(D): O POS
Antibody Screen: NEGATIVE

## 2021-10-31 LAB — ABO/RH: ABO/RH(D): O POS

## 2021-10-31 LAB — RESP PANEL BY RT-PCR (FLU A&B, COVID) ARPGX2
Influenza A by PCR: NEGATIVE
Influenza B by PCR: NEGATIVE
SARS Coronavirus 2 by RT PCR: NEGATIVE

## 2021-10-31 LAB — HCG, QUANTITATIVE, PREGNANCY: hCG, Beta Chain, Quant, S: <1 m[IU]/mL (ref <5)

## 2021-10-31 LAB — LIPASE, BLOOD: Lipase: 28 U/L (ref 11–51)

## 2021-10-31 SURGERY — LAPAROSCOPY OPERATIVE
Anesthesia: General

## 2021-10-31 MED ORDER — SUGAMMADEX SODIUM 200 MG/2ML IV SOLN
INTRAVENOUS | Status: DC | PRN
  Administered 2021-10-31: 200 mg via INTRAVENOUS

## 2021-10-31 MED ORDER — LACTATED RINGERS IV SOLN
INTRAVENOUS | Status: DC | PRN
Start: 2021-10-31 — End: 2021-10-31

## 2021-10-31 MED ORDER — SUCCINYLCHOLINE CHLORIDE 200 MG/10ML IV SOSY
PREFILLED_SYRINGE | INTRAVENOUS | Status: DC | PRN
Start: 2021-10-31 — End: 2021-10-31
  Administered 2021-10-31: 100 mg via INTRAVENOUS

## 2021-10-31 MED ORDER — FENTANYL CITRATE (PF) 100 MCG/2ML IJ SOLN: 25.0000 ug | INTRAMUSCULAR | Status: DC | PRN

## 2021-10-31 MED ORDER — CEFAZOLIN SODIUM-DEXTROSE 2-3 GM-%(50ML) IV SOLR
INTRAVENOUS | Status: DC | PRN
  Administered 2021-10-31: 2 g via INTRAVENOUS

## 2021-10-31 MED ORDER — BUPIVACAINE HCL (PF) 0.25 % IJ SOLN
INTRAMUSCULAR | Status: AC
  Filled 2021-10-31: qty 30

## 2021-10-31 MED ORDER — KETOROLAC TROMETHAMINE 15 MG/ML IJ SOLN
INTRAMUSCULAR | Status: AC
  Administered 2021-10-31: 15 mg
  Filled 2021-10-31: qty 1

## 2021-10-31 MED ORDER — LIDOCAINE 2% (20 MG/ML) 5 ML SYRINGE
INTRAMUSCULAR | Status: AC
  Filled 2021-10-31: qty 5

## 2021-10-31 MED ORDER — ALBUMIN HUMAN 5 % IV SOLN: INTRAVENOUS | Status: DC | PRN

## 2021-10-31 MED ORDER — AMISULPRIDE (ANTIEMETIC) 5 MG/2ML IV SOLN: 10.0000 mg | Freq: Once | INTRAVENOUS | Status: DC | PRN

## 2021-10-31 MED ORDER — FENTANYL CITRATE (PF) 250 MCG/5ML IJ SOLN
INTRAMUSCULAR | Status: AC
  Filled 2021-10-31: qty 5

## 2021-10-31 MED ORDER — MORPHINE SULFATE (PF) 4 MG/ML IV SOLN
4.0000 mg | Freq: Once | INTRAVENOUS | Status: DC
  Filled 2021-10-31: qty 1

## 2021-10-31 MED ORDER — DEXAMETHASONE SODIUM PHOSPHATE 10 MG/ML IJ SOLN
INTRAMUSCULAR | Status: DC | PRN
Start: 2021-10-31 — End: 2021-10-31
  Administered 2021-10-31: 10 mg via INTRAVENOUS

## 2021-10-31 MED ORDER — MIDAZOLAM HCL 2 MG/2ML IJ SOLN
INTRAMUSCULAR | Status: AC
  Filled 2021-10-31: qty 2

## 2021-10-31 MED ORDER — MORPHINE SULFATE (PF) 4 MG/ML IV SOLN
4.0000 mg | Freq: Once | INTRAVENOUS | Status: AC
  Administered 2021-10-31: 4 mg via INTRAVENOUS
  Filled 2021-10-31: qty 1

## 2021-10-31 MED ORDER — BUPIVACAINE HCL (PF) 0.25 % IJ SOLN
INTRAMUSCULAR | Status: DC | PRN
  Administered 2021-10-31: 8 mL

## 2021-10-31 MED ORDER — ONDANSETRON HCL 4 MG/2ML IJ SOLN
4.0000 mg | Freq: Once | INTRAMUSCULAR | Status: AC
  Administered 2021-10-31: 4 mg via INTRAVENOUS
  Filled 2021-10-31: qty 2

## 2021-10-31 MED ORDER — PROPOFOL 10 MG/ML IV BOLUS
INTRAVENOUS | Status: AC
  Filled 2021-10-31: qty 20

## 2021-10-31 MED ORDER — PROPOFOL 10 MG/ML IV BOLUS
INTRAVENOUS | Status: DC | PRN
  Administered 2021-10-31: 130 mg via INTRAVENOUS

## 2021-10-31 MED ORDER — FENTANYL CITRATE (PF) 250 MCG/5ML IJ SOLN
INTRAMUSCULAR | Status: DC | PRN
  Administered 2021-10-31 (×4): 50 ug via INTRAVENOUS

## 2021-10-31 MED ORDER — ONDANSETRON HCL 4 MG/2ML IJ SOLN
INTRAMUSCULAR | Status: DC | PRN
  Administered 2021-10-31: 4 mg via INTRAVENOUS

## 2021-10-31 MED ORDER — OXYCODONE HCL 5 MG PO TABS: 5.0000 mg | ORAL_TABLET | Freq: Four times a day (QID) | ORAL | 0 refills | Status: DC | PRN

## 2021-10-31 MED ORDER — SUCCINYLCHOLINE CHLORIDE 200 MG/10ML IV SOSY
PREFILLED_SYRINGE | INTRAVENOUS | Status: AC
  Filled 2021-10-31: qty 10

## 2021-10-31 MED ORDER — ROCURONIUM BROMIDE 100 MG/10ML IV SOLN
INTRAVENOUS | Status: DC | PRN
  Administered 2021-10-31: 30 mg via INTRAVENOUS
  Administered 2021-10-31: 10 mg via INTRAVENOUS

## 2021-10-31 MED ORDER — KETOROLAC TROMETHAMINE 15 MG/ML IJ SOLN
15.0000 mg | Freq: Once | INTRAMUSCULAR | Status: DC
  Filled 2021-10-31: qty 1

## 2021-10-31 MED ORDER — KETOROLAC TROMETHAMINE 30 MG/ML IJ SOLN: 15.0000 mg | Freq: Once | INTRAMUSCULAR | Status: DC | PRN

## 2021-10-31 MED ORDER — ONDANSETRON HCL 4 MG/2ML IJ SOLN
INTRAMUSCULAR | Status: AC
  Filled 2021-10-31: qty 2

## 2021-10-31 MED ORDER — SODIUM CHLORIDE 0.9 % IR SOLN
Status: DC | PRN
  Administered 2021-10-31: 1000 mL

## 2021-10-31 MED ORDER — ONDANSETRON HCL 4 MG/2ML IJ SOLN
4.0000 mg | Freq: Once | INTRAMUSCULAR | Status: DC
  Filled 2021-10-31: qty 2

## 2021-10-31 MED ORDER — KETOROLAC TROMETHAMINE 30 MG/ML IJ SOLN
INTRAMUSCULAR | Status: AC
  Filled 2021-10-31: qty 1

## 2021-10-31 MED ORDER — ACETAMINOPHEN 325 MG PO TABS: 325.0000 mg | ORAL_TABLET | ORAL | Status: DC | PRN

## 2021-10-31 MED ORDER — FENTANYL CITRATE (PF) 100 MCG/2ML IJ SOLN
INTRAMUSCULAR | Status: AC
  Filled 2021-10-31: qty 2

## 2021-10-31 MED ORDER — ROCURONIUM BROMIDE 10 MG/ML (PF) SYRINGE
PREFILLED_SYRINGE | INTRAVENOUS | Status: AC
  Filled 2021-10-31: qty 10

## 2021-10-31 MED ORDER — DEXAMETHASONE SODIUM PHOSPHATE 10 MG/ML IJ SOLN
INTRAMUSCULAR | Status: AC
  Filled 2021-10-31: qty 1

## 2021-10-31 MED ORDER — LIDOCAINE HCL (CARDIAC) PF 100 MG/5ML IV SOSY
PREFILLED_SYRINGE | INTRAVENOUS | Status: DC | PRN
  Administered 2021-10-31: 60 mg via INTRATRACHEAL

## 2021-10-31 MED ORDER — PHENYLEPHRINE 40 MCG/ML (10ML) SYRINGE FOR IV PUSH (FOR BLOOD PRESSURE SUPPORT)
PREFILLED_SYRINGE | INTRAVENOUS | Status: AC
  Filled 2021-10-31: qty 10

## 2021-10-31 MED ORDER — ARTIFICIAL TEARS OPHTHALMIC OINT
TOPICAL_OINTMENT | OPHTHALMIC | Status: AC
  Filled 2021-10-31: qty 3.5

## 2021-10-31 MED ORDER — ACETAMINOPHEN 160 MG/5ML PO SOLN: 325.0000 mg | ORAL | Status: DC | PRN

## 2021-10-31 MED ORDER — MIDAZOLAM HCL 5 MG/5ML IJ SOLN
INTRAMUSCULAR | Status: DC | PRN
  Administered 2021-10-31: 1 mg via INTRAVENOUS

## 2021-10-31 SURGICAL SUPPLY — 35 items
ADH SKN CLS APL DERMABOND .7 (GAUZE/BANDAGES/DRESSINGS) ×1
APL SRG 38 LTWT LNG FL B (MISCELLANEOUS)
APPLICATOR ARISTA FLEXITIP XL (MISCELLANEOUS) IMPLANT
BAG SPEC RTRVL LRG 6X4 10 (ENDOMECHANICALS)
CABLE HIGH FREQUENCY MONO STRZ (ELECTRODE) IMPLANT
CANISTER SUCT 3000ML PPV (MISCELLANEOUS) ×3 IMPLANT
DECANTER SPIKE VIAL GLASS SM (MISCELLANEOUS) IMPLANT
DERMABOND ADVANCED (GAUZE/BANDAGES/DRESSINGS) ×1
DERMABOND ADVANCED .7 DNX12 (GAUZE/BANDAGES/DRESSINGS) IMPLANT
DRSG OPSITE POSTOP 3X4 (GAUZE/BANDAGES/DRESSINGS) ×1 IMPLANT
DURAPREP 26ML APPLICATOR (WOUND CARE) ×3 IMPLANT
GLOVE SURG ENC MOIS LTX SZ7 (GLOVE) ×3 IMPLANT
GLOVE SURG UNDER POLY LF SZ7 (GLOVE) ×2 IMPLANT
HEMOSTAT ARISTA ABSORB 3G PWDR (HEMOSTASIS) IMPLANT
IRRIGATOR SUCT 8 DISP DVNC XI (IRRIGATION / IRRIGATOR) IMPLANT
IRRIGATOR SUCTION 8MM XI DISP (IRRIGATION / IRRIGATOR) ×2
KIT TURNOVER KIT B (KITS) ×2 IMPLANT
LIGASURE VESSEL 5MM BLUNT TIP (ELECTROSURGICAL) IMPLANT
NEEDLE HYPO 22GX1.5 SAFETY (NEEDLE) ×1 IMPLANT
NS IRRIG 1000ML POUR BTL (IV SOLUTION) ×2 IMPLANT
PACK LAPAROSCOPY BASIN (CUSTOM PROCEDURE TRAY) ×2 IMPLANT
PACK TRENDGUARD 450 HYBRID PRO (MISCELLANEOUS) IMPLANT
POUCH LAPAROSCOPIC INSTRUMENT (MISCELLANEOUS) ×1 IMPLANT
POUCH SPECIMEN RETRIEVAL 10MM (ENDOMECHANICALS) IMPLANT
PROTECTOR NERVE ULNAR (MISCELLANEOUS) ×4 IMPLANT
SET TUBE SMOKE EVAC HIGH FLOW (TUBING) ×3 IMPLANT
SLEEVE ENDOPATH XCEL 5M (ENDOMECHANICALS) ×2 IMPLANT
SUT VIC AB 3-0 PS2 18 (SUTURE) ×2
SUT VIC AB 3-0 PS2 18XBRD (SUTURE) ×2 IMPLANT
SUT VICRYL 0 UR6 27IN ABS (SUTURE) ×4 IMPLANT
TOWEL GREEN STERILE FF (TOWEL DISPOSABLE) ×4 IMPLANT
TRENDGUARD 450 HYBRID PRO PACK (MISCELLANEOUS)
TROCAR BALLN 12MMX100 BLUNT (TROCAR) ×2 IMPLANT
TROCAR OPTI TIP 5M 100M (ENDOMECHANICALS) ×2 IMPLANT
WARMER LAPAROSCOPE (MISCELLANEOUS) ×3 IMPLANT

## 2021-10-31 NOTE — Anesthesia Preprocedure Evaluation (Signed)
Anesthesia Evaluation  Patient identified by MRN, date of birth, ID band Patient awake    Reviewed: Allergy & Precautions, NPO status , Patient's Chart, lab work & pertinent test results  Airway Mallampati: II  TM Distance: >3 FB Neck ROM: Full    Dental  (+) Dental Advisory Given   Pulmonary asthma ,    breath sounds clear to auscultation       Cardiovascular negative cardio ROS   Rhythm:Regular Rate:Normal     Neuro/Psych negative neurological ROS     GI/Hepatic negative GI ROS, Neg liver ROS,   Endo/Other  negative endocrine ROS  Renal/GU negative Renal ROS   Ovarian torsion    Musculoskeletal   Abdominal   Peds  Hematology negative hematology ROS (+)   Anesthesia Other Findings   Reproductive/Obstetrics                             Lab Results  Component Value Date   WBC 8.7 10/31/2021   HGB 14.6 10/31/2021   HCT 43.4 10/31/2021   MCV 86.6 10/31/2021   PLT 165 10/31/2021   Lab Results  Component Value Date   CREATININE 0.84 10/31/2021   BUN 22 (H) 10/31/2021   NA 135 10/31/2021   K 3.5 10/31/2021   CL 107 10/31/2021   CO2 17 (L) 10/31/2021     Anesthesia Physical Anesthesia Plan  ASA: 2 and emergent  Anesthesia Plan: General   Post-op Pain Management: Ofirmev IV (intra-op) and Toradol IV (intra-op)   Induction: Intravenous  PONV Risk Score and Plan: 3 and Dexamethasone, Ondansetron and Treatment may vary due to age or medical condition  Airway Management Planned: Oral ETT  Additional Equipment: None  Intra-op Plan:   Post-operative Plan: Extubation in OR  Informed Consent: I have reviewed the patients History and Physical, chart, labs and discussed the procedure including the risks, benefits and alternatives for the proposed anesthesia with the patient or authorized representative who has indicated his/her understanding and acceptance.     Dental  advisory given  Plan Discussed with: CRNA  Anesthesia Plan Comments:         Anesthesia Quick Evaluation

## 2021-10-31 NOTE — Anesthesia Procedure Notes (Signed)
Procedure Name: Intubation Date/Time: 10/31/2021 8:02 PM Performed by: Clovis Cao, CRNA Pre-anesthesia Checklist: Patient identified, Emergency Drugs available, Suction available and Patient being monitored Patient Re-evaluated:Patient Re-evaluated prior to induction Oxygen Delivery Method: Circle system utilized Preoxygenation: Pre-oxygenation with 100% oxygen Induction Type: IV induction, Rapid sequence and Cricoid Pressure applied Laryngoscope Size: Miller and 2 Grade View: Grade I Tube type: Oral Tube size: 7.0 mm Number of attempts: 1 Airway Equipment and Method: Stylet Placement Confirmation: ETT inserted through vocal cords under direct vision, positive ETCO2 and breath sounds checked- equal and bilateral Secured at: 21 cm Tube secured with: Tape Dental Injury: Teeth and Oropharynx as per pre-operative assessment

## 2021-10-31 NOTE — ED Notes (Signed)
Patient transported to Ultrasound 

## 2021-10-31 NOTE — ED Provider Notes (Signed)
[place] EMERGENCY DEPARTMENT Provider Note   CSN: [id number] Arrival date & time: 10/31/21  1610     History  Chief Complaint  Patient presents with   Abdominal Pain    Diana Friedman is a 21 y.o. female.  21 yo female with recently diagnosed ovarian cyst presenting for severe left lower quadrant pain that started approx 3 hours ago, severe, radiating to the left abdomen and left back. Pt was seen for similar pain that was less severe approx 10 days ago with negative CT renal stone, serum pregnancy, and UA. Positive for multiple left sided ovarian cysts no torsion at that time    The history is provided by the patient. No language interpreter was used.  Abdominal Pain Associated symptoms: no chest pain, no chills, no cough, no dysuria, no fever, no hematuria, no shortness of breath, no sore throat and no vomiting       Home Medications Prior to Admission medications   Medication Sig Start Date End Date Taking? Authorizing Provider  acetaminophen (TYLENOL) 500 MG tablet Take 1,000 mg by mouth every 6 (six) hours as needed for moderate pain or headache.    [provider]  albuterol (VENTOLIN HFA) 108 (90 Base) MCG/ACT inhaler Inhale 2 puffs into the lungs every 4 (four) hours as needed for shortness of breath or wheezing. 05/26/21   [provider]  Drospirenone-Estetrol 3-14.2 MG TABS Take 1 tablet by mouth daily. 10/20/21   Florian Buff, MD  fluticasone (FLONASE) 50 MCG/ACT nasal spray Place 1 spray into both nostrils daily as needed for allergies or rhinitis.    [provider]  Fluticasone-Umeclidin-Vilant (TRELEGY ELLIPTA IN) Inhale 2 puffs into the lungs daily.    [provider]  ibuprofen (ADVIL) 200 MG tablet Take 600 mg by mouth every 6 (six) hours as needed for headache or moderate pain.    [provider]  ketorolac (TORADOL) 10 MG tablet Take 1 tablet (10 mg total) by mouth every 8 (eight) hours as needed.  10/20/21   Florian Buff, MD  oxyCODONE-acetaminophen (PERCOCET/ROXICET) 5-325 MG tablet Take 1 tablet by mouth every 4 (four) hours as needed for severe pain. 10/20/21   Florian Buff, MD  pseudoephedrine (SUDAFED) 60 MG tablet Take 60 mg by mouth every 4 (four) hours as needed for congestion.    [provider]  spironolactone (ALDACTONE) 100 MG tablet Take 100 mg by mouth at bedtime. 09/05/21   [provider]      Allergies    Pineapple    Review of Systems   Review of Systems  Constitutional:  Negative for chills and fever.  HENT:  Negative for ear pain and sore throat.   Eyes:  Negative for pain and visual disturbance.  Respiratory:  Negative for cough and shortness of breath.   Cardiovascular:  Negative for chest pain and palpitations.  Gastrointestinal:  Positive for abdominal pain. Negative for vomiting.  Genitourinary:  Negative for dysuria and hematuria.  Musculoskeletal:  Negative for arthralgias and back pain.  Skin:  Negative for color change and rash.  Neurological:  Negative for seizures and syncope.  All other systems reviewed and are negative.  Physical Exam Updated Vital Signs BP 132/74    Resp (!) 24  Physical Exam Vitals and nursing note reviewed.  Constitutional:      General: She is in acute distress.     Appearance: She is well-developed.  HENT:     Head: Normocephalic and  atraumatic.  Eyes:     Conjunctiva/sclera: Conjunctivae normal.  Cardiovascular:     Rate and Rhythm: Normal rate and regular rhythm.     Heart sounds: No murmur heard. Pulmonary:     Effort: Pulmonary effort is normal. No respiratory distress.     Breath sounds: Normal breath sounds.  Abdominal:     Palpations: Abdomen is soft.     Tenderness: There is abdominal tenderness in the left lower quadrant. There is guarding.  Musculoskeletal:        General: No swelling.     Cervical back: Neck supple.  Skin:    General: Skin is warm and dry.     Capillary Refill:  Capillary refill takes less than 2 seconds.  Neurological:     Mental Status: She is alert.  Psychiatric:        Mood and Affect: Mood normal.    ED Results / Procedures / Treatments   Labs (all labs ordered are listed, but only abnormal results are displayed) Labs Reviewed  CBC WITH DIFFERENTIAL/PLATELET  COMPREHENSIVE METABOLIC PANEL  LIPASE, BLOOD  HCG, QUANTITATIVE, PREGNANCY    EKG None  Radiology No results found.  Procedures .Critical Care Performed by: Lianne Cure, DO Authorized by: Lianne Cure, DO   Critical care provider statement:    Critical care time (minutes):  38   Critical care was necessary to treat or prevent imminent or life-threatening deterioration of the following conditions: ovarian torsion. pt went straight to OR from ED.   Critical care was time spent personally by me on the following activities:  Development of treatment plan with patient or surrogate, discussions with consultants, evaluation of patient's response to treatment, examination of patient, ordering and review of laboratory studies, ordering and review of radiographic studies, ordering and performing treatments and interventions, pulse oximetry, re-evaluation of patient's condition and review of old charts Comments:     Dicussed care with OBGYN    Medications Ordered in ED Medications  morphine (PF) 4 MG/ML injection 4 mg (has no administration in time range)  ondansetron (ZOFRAN) injection 4 mg (has no administration in time range)  ketorolac (TORADOL) 15 MG/ML injection 15 mg (has no administration in time range)    ED Course/ Medical Decision Making/ A&P                           Medical Decision Making Amount and/or Complexity of Data Reviewed Labs: ordered. Radiology: ordered.  Risk Prescription drug management. Decision regarding hospitalization.   4:48 PM 21 yo female with recently diagnosed ovarian cyst presenting for severe left lower quadrant pain that  started approx 3 hours ago, severe, radiating to the left abdomen and left back. On exam patient is diaphoretic, bent over in pain, crying, and holding abdomen. Ultrasound called personally by myself to convey concern for torsion and medical emergency. Ultrasound tech upstairs but states she will take patient back next.   US demonstrates evidence of torsion: Enlarged and heterogeneous left ovary. No color Doppler flow and no arteriovenous waveforms identified within the left ovary compatible with ovarian torsion. 2. Small volume of free fluid identified within the pelvis which may be physiologic in a premenopausal female. 3. IUD is identified.  Spoke with on call GYN who agrees to come down to see patient within the next 10 min. Type and screen collected. Covid pcr sent. Pt npo.   Patient going straight to OR.  Final Clinical Impression(s) / ED Diagnoses Final diagnoses:  Ovarian torsion    Rx / DC Orders ED Discharge Orders     None         Lianne Cure, DO [id number]

## 2021-10-31 NOTE — H&P (Signed)
Diana Friedman is an 21 y.o. female G0 who presented to ED with increased left lower abdominal pain that had been monitored for about a week.  She has been evaluated several times and noted to have a left ovarian enlargement with 2 cysts and up to about 7cm total.  [place] to date had demonstrated normal flow to the ovary but there was clinical suspicion for intermittent torsion.  Today her pain got much worse and she came to the ED where torsion was confirmed on [place] with no flow to ovary.    Pertinent Gynecological History: IUD in place   Menstrual History:  No LMP recorded. (Menstrual status: IUD).    Past Medical History:  Diagnosis Date   Asthma     No past surgical history on file.  No family history on file.  Social History:  reports that she has never smoked. She has never been exposed to tobacco smoke. She has never used smokeless tobacco. She reports current alcohol use. She reports that she does not currently use drugs.  Allergies:  Allergies  Allergen Reactions   Pineapple     Stomach pain    (Not in a hospital admission)   Review of Systems  Constitutional:  Negative for fever.  Gastrointestinal:  Positive for abdominal pain.  Genitourinary:  Negative for vaginal bleeding.   Blood pressure 117/77, pulse (!) 56, resp. rate 20, SpO2 100 %. Physical Exam  AOX3, In apparent distress Normal work of breathing, tachypnic due to pain Abd + rebound, + guarding  Results for orders placed or performed during the hospital encounter of 10/31/21 (from the past 24 hour(s))  CBC with Differential     Status: None   Collection Time: 10/31/21  4:33 PM  Result Value Ref Range   WBC 8.7 4.0 - 10.5 K/uL   RBC 5.01 3.87 - 5.11 MIL/uL   Hemoglobin 14.6 12.0 - 15.0 g/dL   HCT 43.4 36.0 - 46.0 %   MCV 86.6 80.0 - 100.0 fL   MCH 29.1 26.0 - 34.0 pg   MCHC 33.6 30.0 - 36.0 g/dL   RDW 12.9 11.5 - 15.5 %   Platelets 165 150 - 400 K/uL   nRBC 0.0 0.0 - 0.2 %   Neutrophils Relative %  68 %   Neutro Abs 5.9 1.7 - 7.7 K/uL   Lymphocytes Relative 24 %   Lymphs Abs 2.1 0.7 - 4.0 K/uL   Monocytes Relative 4 %   Monocytes Absolute 0.4 0.1 - 1.0 K/uL   Eosinophils Relative 3 %   Eosinophils Absolute 0.3 0.0 - 0.5 K/uL   Basophils Relative 1 %   Basophils Absolute 0.1 0.0 - 0.1 K/uL   Immature Granulocytes 0 %   Abs Immature Granulocytes 0.03 0.00 - 0.07 K/uL  Comprehensive metabolic panel     Status: Abnormal   Collection Time: 10/31/21  4:33 PM  Result Value Ref Range   Sodium 135 135 - 145 mmol/L   Potassium 3.5 3.5 - 5.1 mmol/L   Chloride 107 98 - 111 mmol/L   CO2 17 (L) 22 - 32 mmol/L   Glucose, Bld 94 70 - 99 mg/dL   BUN 22 (H) 6 - 20 mg/dL   Creatinine, Ser 0.84 0.44 - 1.00 mg/dL   Calcium 9.9 8.9 - 10.3 mg/dL   Total Protein 7.7 6.5 - 8.1 g/dL   Albumin 4.6 3.5 - 5.0 g/dL   AST 20 15 - 41 U/L   ALT 14 0 - 44 U/L  Alkaline Phosphatase 44 38 - 126 U/L   Total Bilirubin 1.6 (H) 0.3 - 1.2 mg/dL   GFR, Estimated >60 >60 mL/min   Anion gap 11 5 - 15  Lipase, blood     Status: None   Collection Time: 10/31/21  4:33 PM  Result Value Ref Range   Lipase 28 11 - 51 U/L  hCG, quantitative, pregnancy     Status: None   Collection Time: 10/31/21  4:34 PM  Result Value Ref Range   hCG, Beta Chain, Quant, S <1 <5 mIU/mL    US PELVIC COMPLETE WITH TRANSVAGINAL  Addendum Date: 10/31/2021   ADDENDUM REPORT: 10/31/2021 18:47 ADDENDUM: Critical Value/emergent results were called by telephone at the time of interpretation on 10/31/2021 at 6:47 pm to provider Rutherford Limerick, who verbally acknowledged these results. Electronically Signed   By: Kerby Moors M.D.   On: 10/31/2021 18:47   Result Date: 10/31/2021 CLINICAL DATA:  Left lower pelvic pain for 2 weeks. EXAM: TRANSABDOMINAL AND TRANSVAGINAL ULTRASOUND OF PELVIS DOPPLER ULTRASOUND OF OVARIES TECHNIQUE: Both transabdominal and transvaginal ultrasound examinations of the pelvis were performed. Transabdominal technique was  performed for global imaging of the pelvis including uterus, ovaries, adnexal regions, and pelvic cul-de-sac. It was necessary to proceed with endovaginal exam following the transabdominal exam to visualize the ovaries. Color and duplex Doppler ultrasound was utilized to evaluate blood flow to the ovaries. COMPARISON:  None. FINDINGS: Uterus Measurements: 7.6 x 3.6 x 2.8 cm = volume: 36 mL. No fibroids or other mass visualized. Endometrium Thickness: 6 mm.  IUD is identified Right ovary Measurements: 3.5 x 2.2 x 2.2 cm = volume: 9.0 mL. Normal appearance/no adnexal mass. Left ovary Measurements: 7.8 x 3.6 x 7.6 cm = volume: 112.8 mL. Enlarged and heterogeneous. There are 2 dominant cyst within the left ovary. The largest measures 4.5 by 5.0 x 2.2 cm. Pulsed Doppler evaluation of right ovary demonstrates normal low-resistance arterial and venous waveforms. There is no arterial or venous waveforms within the left ovary. No flow identified on the color Doppler images within the left ovary. Other findings Small volume of free fluid identified within the pelvis. IMPRESSION: 1. Enlarged and heterogeneous left ovary. No color Doppler flow and no arteriovenous waveforms identified within the left ovary compatible with ovarian torsion. 2. Small volume of free fluid identified within the pelvis which may be physiologic in a premenopausal female. 3. IUD is identified. Electronically Signed: By: Kerby Moors M.D. On: 10/31/2021 18:19    Assessment/Plan: Pt with prior suspected intermittent torsion of ovary and now confirmed absent blood flow on [place].  Plan is laparoscopic evaluation and de-torsion.  Dr. Harrington Challenger is coming in and will perform surgery with Dr. Elonda Husky.    Pt with acute, persistent abdominal pain with confirmed ovarian torsion on [place] with no doppler flow. Will proceed to OR.  [name] 10/31/2021, 7:20 PM

## 2021-10-31 NOTE — ED Triage Notes (Addendum)
BIB [facility]. Abdominal pain  left side x2 weeks. Has had several tests/scans with previous visits to urgent car/ED/ and OB. -N/-V. No CP, No SOB. No changes to urinary function, last BM 2 days ago.Tolerating PO fluids and foods.  Took percocet, muscle relaxer about 1 hr ago.

## 2021-10-31 NOTE — Brief Op Note (Signed)
10/31/2021  8:59 PM  PATIENT:  Diana Friedman  21 y.o. female  PRE-OPERATIVE DIAGNOSIS:  Left Ovarian Torsion  POST-OPERATIVE DIAGNOSIS:  Left Ovarian Torsion  PROCEDURE:  Procedure(s): OPERATIVE LAPAROSCOPY FOR LEFT OVARIAN TORSION (N/A)  SURGEON:  Surgeon(s) and Role:    Waynard Reeds, MD - Primary    * Eure, Amaryllis Dyke, MD - Assisting   ANESTHESIA:   local and general  EBL:  50 mL   BLOOD ADMINISTERED:none  DRAINS: Urinary Catheter (Foley)   LOCAL MEDICATIONS USED:  MARCAINE     SPECIMEN:  No Specimen  DISPOSITION OF SPECIMEN:  N/A  COUNTS:  YES  TOURNIQUET:  * No tourniquets in log *  DICTATION: .Dragon Dictation  PLAN OF CARE: Discharge to home after PACU  PATIENT DISPOSITION:  PACU - hemodynamically stable.   Delay start of Pharmacological VTE agent (>24hrs) due to surgical blood loss or risk of bleeding: not applicable

## 2021-10-31 NOTE — Transfer of Care (Signed)
Immediate Anesthesia Transfer of Care Note  Patient: Emelia Arnell  Procedure(s) Performed: OPERATIVE LAPAROSCOPY FOR LEFT OVARIAN TORSION  Patient Location: PACU  Anesthesia Type:General  Level of Consciousness: awake  Airway & Oxygen Therapy: Patient Spontanous Breathing  Post-op Assessment: Report given to RN and Post -op Vital signs reviewed and stable  Post vital signs: Reviewed and stable  Last Vitals:  Vitals Value Taken Time  BP 100/50 10/31/21 2115  Temp    Pulse 99 10/31/21 2116  Resp 13 10/31/21 2116  SpO2 100 % 10/31/21 2116  Vitals shown include unvalidated device data.  Last Pain:  Vitals:   10/31/21 1643  PainSc: 5          Complications: No notable events documented.

## 2021-11-01 NOTE — Op Note (Signed)
Pre-Operative Diagnosis: 1) Left Ovarian torsion Postoperative Diagnosis: 1) Left Ovarian torsion  Procedure: Laparoscopic left ovarian detorsion with excision of ovarian capsule and decompression of venous congestion Surgeon: Dr. Waynard Reeds Assistant: Dr.Luke Eure Operative Findings: Normal appearing nulliparous uterus, normal appearing right ovary and tube. Enlarged, edematous left ovary with vascular engorgement . 180 degree twist in the ovary at the level of entry of the ovarian vessels into the ovary. Left tube with mild edema and ecchymosis but not involved in the torsion. Infundibulopelvic ligament and suspensory ligament of the left ovary normal appearing Specimen: None EBL: 50cc  Ms. Diana Friedman Is a 21 year old gravida 0 para 0 who presents for definitive surgical management for Left ovarian torsion. Please see the patient's history and physical for complete details of the history. Management options were discussed with the patient. R/B/A reviewed. Following appropriate informed consent was taken to the operating room. The patient was appropriately identified during a time out procedure. General anesthesia was administered and the patient was placed in the dorsal lithotomy position. The patient was prepped and draped in the normal sterile fashion. A speculum was placed into the vagina, a single-tooth tenaculum was placed on the anterior lip of the cervix, and a Hulka uterine manipulator was placed. The speculum was then removed.  Attention was then turned to the abdominal portion of the case.  A flat position of the OR table was confirmed.  10 cc of quarter percent Marcaine were injected infraumbilically.  A 10 mm infraumbilical semilunar incision was made and the underlying soft tissue was dissected down to the level of the fascia using hemostat.  The fascia was grasped x2 and elevated and Mayo scissors were used to incise the fascia.  The abdominal peritoneum was not yet entered.  This was achieved  with a combination of both sharp and blunt dissection.  The [facility] port was then placed and entry into the abdominal cavity was confirmed with direct visualization.  The abdomen was then insufflated.  The above findings were noted.  A 5 mm port was placed in the right lower quadrant under direct visualization after the skin was injected with Marcaine and the skin was incised with a scalpel. The same procedure was repeated on the left. The Left ovary was then detorsed. Improvement in the appearance of the ovary was noted. The ovarian cortex was incised at the superior and inferior poles of the ovary to relieve vascular congestion. This completed the operative procedure. The right and left lower quadrant ports were removed, and the abdomen was desufflated. The umbilical port was then removed. The defect in the fascia was repaired with #1 vicryl in a running fashion and dermabond. The skin was closed with 4-0 vicryl in a subcuticular fashion. The right and left lower quadrant 5 mm ports were closed with dermabond. All sponge, instrument and needle counts were correct. The single toothed tenaculum and hulka manipulator were removed from the vagina and the patient was extubated in the OR and transferred to the PACU in stable condition

## 2021-11-02 NOTE — Anesthesia Postprocedure Evaluation (Signed)
Anesthesia Post Note  Patient: Therapist, music  Procedure(s) Performed: OPERATIVE LAPAROSCOPY FOR LEFT OVARIAN TORSION     Patient location during evaluation: PACU Anesthesia Type: General Level of consciousness: awake and alert Pain management: pain level controlled Vital Signs Assessment: post-procedure vital signs reviewed and stable Respiratory status: spontaneous breathing, nonlabored ventilation, respiratory function stable and patient connected to nasal cannula oxygen Cardiovascular status: blood pressure returned to baseline and stable Postop Assessment: no apparent nausea or vomiting Anesthetic complications: no   No notable events documented.  Last Vitals:  Vitals:   10/31/21 2145 10/31/21 2152  BP: 102/61 102/61  Pulse: 72 72  Resp: 13 14  Temp:  36.7 C  SpO2: 100% 100%    Last Pain:  Vitals:   10/31/21 2145  PainSc: 0-No pain                 Tiajuana Amass

## 2021-11-03 ENCOUNTER — Encounter (HOSPITAL_COMMUNITY): Payer: Self-pay | Admitting: Obstetrics and Gynecology

## 2021-11-21 DIAGNOSIS — Z681 Body mass index (BMI) 19 or less, adult: Secondary | ICD-10-CM | POA: Diagnosis not present

## 2021-11-21 DIAGNOSIS — N83202 Unspecified ovarian cyst, left side: Secondary | ICD-10-CM | POA: Diagnosis not present

## 2021-11-25 ENCOUNTER — Encounter: Payer: Self-pay | Admitting: *Deleted

## 2021-11-25 ENCOUNTER — Ambulatory Visit (INDEPENDENT_AMBULATORY_CARE_PROVIDER_SITE_OTHER): Payer: BC Managed Care – PPO | Admitting: Allergy and Immunology

## 2021-11-25 ENCOUNTER — Telehealth: Payer: Self-pay | Admitting: *Deleted

## 2021-11-25 ENCOUNTER — Other Ambulatory Visit: Payer: Self-pay

## 2021-11-25 ENCOUNTER — Encounter: Payer: Self-pay | Admitting: Allergy and Immunology

## 2021-11-25 VITALS — BP 100/60 | HR 91 | Temp 98.2°F | Resp 18 | Ht 65.0 in | Wt 111.0 lb

## 2021-11-25 DIAGNOSIS — J3089 Other allergic rhinitis: Secondary | ICD-10-CM | POA: Diagnosis not present

## 2021-11-25 DIAGNOSIS — Z720 Tobacco use: Secondary | ICD-10-CM

## 2021-11-25 DIAGNOSIS — J324 Chronic pansinusitis: Secondary | ICD-10-CM | POA: Diagnosis not present

## 2021-11-25 DIAGNOSIS — K219 Gastro-esophageal reflux disease without esophagitis: Secondary | ICD-10-CM | POA: Diagnosis not present

## 2021-11-25 DIAGNOSIS — J4550 Severe persistent asthma, uncomplicated: Secondary | ICD-10-CM | POA: Diagnosis not present

## 2021-11-25 DIAGNOSIS — D7219 Other eosinophilia: Secondary | ICD-10-CM

## 2021-11-25 DIAGNOSIS — J301 Allergic rhinitis due to pollen: Secondary | ICD-10-CM

## 2021-11-25 MED ORDER — BREZTRI AEROSPHERE 160-9-4.8 MCG/ACT IN AERO: 2.0000 | INHALATION_SPRAY | Freq: Two times a day (BID) | RESPIRATORY_TRACT | 1 refills | Status: DC

## 2021-11-25 NOTE — Patient Instructions (Addendum)
°  1.  Allergen avoidance measures - dust mite, cat, dog, pollens, molds  2.  Treat and prevent inflammation:  A. OTC Nasacort - 1 spray each nostril 2 times per day B. Breztri - 2 inhalations 2 times per day w/ spacer (empty lungs)  3.  Treat and prevent reflux:  A. Consolidate  caffeine, chocolate, alcohol B. Omeprazole 40 mg - 1 tablet 1 time per day  4.  Treat and prevent vape damage:  A. Slowly convert to oral nicotine  5.  If needed:  A. Albuterol HFA - 2 inhalations every 4-6 hours B. Antihistamine C. Nasal saline  6.  Obtain a sinus CT scan for chronic sinusitis  7. Immunotherapy??? Biologic agent???  8. Return to clinic in 4 weeks or earlier if problem

## 2021-11-25 NOTE — Progress Notes (Signed)
[place] - [place] - [place] - [place] - [place]   Dear Dr. Clelia Croft,  Thank you for referring Diana Friedman to the [facility] [place] on 11/25/2021.   Below is a summation of this patient's evaluation and recommendations.  Thank you for your referral. I will keep you informed about this patient's response to treatment.   If you have any questions please do not hesitate to contact me.   Sincerely,  Jessica Priest, MD Allergy / Immunology [facility]   ______________________________________________________________________    NEW PATIENT NOTE  Referring Provider: Lupita Raider, MD Primary Provider: Lupita Raider, MD Date of office visit: 11/25/2021    Subjective:   Chief Complaint:  Diana Friedman, 2002) is a 21 y.o. female who presents to the clinic on 11/25/2021 with a chief complaint of Allergy Testing, Asthma, Cough, and Nasal Congestion .     HPI: Diana Friedman presents to this clinic in evaluation of several different issues.  Diana Friedman has a long history of asthma and allergic rhinitis that was under very good control until Diana Friedman contracted COVID about 2 years ago.  Since that point in time Diana Friedman has done much worse regarding both of these conditions.  Specifically, Diana Friedman has constant stuffy nose and nasal congestion and pressure in her face and blows out clear to yellow-green nasal discharge and sneezes and has intermittent anosmia.  Diana Friedman has been treated with 3 antibiotics in 2022 for this issue.  Diana Friedman informs me that Diana Friedman has also had four systemic steroids in 2022 for this issue.  Provoking factors for her upper airway disease include exposure to dust and cats and dogs and while at work when Diana Friedman is exposed to various aerosols.  In addition, Diana Friedman has coughing and wheezing and Diana Friedman has shortness of breath and Diana Friedman stopped exercising because Diana Friedman cannot get her breath when doing this activity.   When Diana Friedman uses a short acting bronchodilator it does appear to result in some improvement regarding this issue. Diana Friedman has been given so many inhalers in the past that Diana Friedman is so confused about what to use that Diana Friedman does not use any controller inhalers at this point.  There is also an issue with her having heartburn very high up in her chest for which Diana Friedman will take Tums about every week.  Diana Friedman has 1 caffeinated drink and has 1 soda daily and has chocolate about 1 time per week and has tequila about 3 times per week.  Diana Friedman is vaping nicotine substances "a lot".  Past Medical History:  Diagnosis Date   Asthma     Past Surgical History:  Procedure Laterality Date   LAPAROSCOPY N/A 10/31/2021   Procedure: OPERATIVE LAPAROSCOPY FOR LEFT OVARIAN TORSION;  Surgeon: Waynard Reeds, MD;  Location: [facility] OR;  Service: Gynecology;  Laterality: N/A;    Allergies as of 11/25/2021       Reactions   Pineapple    Stomach pain        Medication List    albuterol 108 (90 Base) MCG/ACT inhaler Commonly known as: VENTOLIN HFA Inhale 2 puffs into the lungs every 6 (six) hours as needed for wheezing or shortness of breath.   Breztri Aerosphere 160-9-4.8 MCG/ACT Aero Generic drug: Budeson-Glycopyrrol-Formoterol Inhale 2 puffs into the lungs in the morning and at bedtime. Started by: Jessica Priest, MD   Olopatadine-Mometasone 8035669865 MCG/ACT Susp Place 1 spray into the nose daily as needed.  spironolactone 100 MG tablet Commonly known as: ALDACTONE Take 100 mg by mouth daily.   Trelegy Ellipta 200-62.5-25 MCG/ACT Aepb Generic drug: Fluticasone-Umeclidin-Vilant Inhale 2 puffs into the lungs daily.    Review of systems negative except as noted in HPI / PMHx or noted below:  Review of Systems  Constitutional: Negative.   HENT: Negative.    Eyes: Negative.   Respiratory: Negative.    Cardiovascular: Negative.   Gastrointestinal: Negative.   Genitourinary: Negative.   Musculoskeletal: Negative.    Skin: Negative.   Neurological: Negative.   Endo/Heme/Allergies: Negative.   Psychiatric/Behavioral: Negative.     Family History  Problem Relation Age of Onset   Allergic rhinitis Father    Allergic rhinitis Brother    Allergic rhinitis Brother     Social History   Socioeconomic History   Marital status: Single    Spouse name: Not on file   Number of children: Not on file   Years of education: Not on file   Highest education level: Not on file  Occupational History   Not on file  Tobacco Use   Smoking status: Never    Passive exposure: Never   Smokeless tobacco: Never  Vaping Use   Vaping Use: Former   Substances: Nicotine, Flavoring  Substance and Sexual Activity   Alcohol use: Yes    Comment: Social   Drug use: Not Currently   Sexual activity: Yes    Birth control/protection: I.U.D.  Other Topics Concern   Not on file  Social History Narrative   Not on file   Environmental and Social history  Lives in a house with a dry environment, no animals located inside the household, no carpet in the bedroom, plastic on the bed, no plastic on the pillow, and actively vaping nicotine substances.  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  Objective:   Vitals:   11/25/21 0947  BP: 100/60  Pulse: 91  Resp: 18  Temp: 98.2 F (36.8 C)  SpO2: 98%   Height: 5\' 5"  (165.1 cm) Weight: 111 lb (50.3 kg)  Physical Exam Constitutional:      Appearance: Diana Friedman is not diaphoretic.  HENT:     Head: Normocephalic.     Right Ear: Tympanic membrane, ear canal and external ear normal.     Left Ear: Tympanic membrane, ear canal and external ear normal.     Nose: Nose normal. No mucosal edema or rhinorrhea.     Mouth/Throat:     Pharynx: Uvula midline. No oropharyngeal exudate.  Eyes:     Conjunctiva/sclera: Conjunctivae normal.  Neck:     Thyroid: No thyromegaly.     Trachea: Trachea normal. No tracheal tenderness or tracheal deviation.  Cardiovascular:     Rate and Rhythm: Normal rate  and regular rhythm.     Heart sounds: Normal heart sounds, S1 normal and S2 normal. No murmur heard. Pulmonary:     Effort: No respiratory distress.     Breath sounds: Normal breath sounds. No stridor. No wheezing or rales.  Lymphadenopathy:     Head:     Right side of head: No tonsillar adenopathy.     Left side of head: No tonsillar adenopathy.     Cervical: No cervical adenopathy.  Skin:    Findings: No erythema or rash.     Nails: There is no clubbing.  Neurological:     Mental Status: Diana Friedman is alert.    Diagnostics: Allergy skin tests were performed.  Diana Friedman demonstrated hypersensitivity to house dust  mite, cat, dog, trees, grasses, weeds, and molds.  Spirometry was performed and demonstrated an FEV1 of 2.30 @ 67 % of predicted. FEV1/FVC = 0.80.  Following the administration of nebulized albuterol her FEV1 rose to 2.80 which was a calculated improvement of 22%.  The patient had an Asthma Control Test with the following results: ACT Total Score: 21.    Results of blood tests obtained 31 October 2021 identified WBC 8.7, absolute eosinophil 300, absolute lymphocyte 2100, hemoglobin 14.6, platelet 165.  Results of a chest x-ray obtained 12 December 2020 identified the following:  The cardiomediastinal contours are within normal limits. The lungs are clear. No pneumothorax or pleural effusion. No acute finding in the visualized skeleton.   Assessment and Plan:    1. Not well controlled severe persistent asthma   2. Perennial allergic rhinitis   3. Seasonal allergic rhinitis due to pollen   4. Chronic pansinusitis   5. Gastroesophageal reflux disease, unspecified whether esophagitis present   6. Other eosinophilia   7. Vapes nicotine containing substance     1.  Allergen avoidance measures - dust mite, cat, dog, pollens, molds  2.  Treat and prevent inflammation:  A. OTC Nasacort - 1 spray each nostril 2 times per day B. Breztri - 2 inhalations 2 times per day w/ spacer (empty  lungs)  3.  Treat and prevent reflux:  A. Consolidate  caffeine, chocolate, alcohol B. Omeprazole 40 mg - 1 tablet 1 time per day  4.  Treat and prevent vape damage:  A. Slowly convert to oral nicotine  5.  If needed:  A. Albuterol HFA - 2 inhalations every 4-6 hours B. Antihistamine C. Nasal saline  6.  Obtain a sinus CT scan for chronic sinusitis  7. Immunotherapy??? Biologic agent???  8. 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  I am going to treat her with a combination of allergen avoidance measures and the use of anti-inflammatory agents for her airway utilizing consistent basis and also address her issue of reflux.  I did have a talk with her today about tapering off her vape and converting over to oral nicotine.  Because Diana Friedman has a history of ugly nasal discharge and has required 3 antibiotics in 2022 to treat sinusitis and has intermittent anosmia we will obtain a sinus CT scan to examine for chronic sinusitis.  Should Diana Friedman not respond to the therapy noted above Diana Friedman would be a candidate for immunotherapy and possibly a biologic agent given her eosinophilia.  I will see her back in this clinic in 4 weeks or earlier if there is a problem.  Jessica Priest, MD Allergy / Immunology [facility] [place]

## 2021-11-25 NOTE — Telephone Encounter (Signed)
Called patients insurance and submitted a Prior Authorization for Sinus CT Scan. PA has been approved. Approval number is [id number], called [facility] and provided approval number. Attempted to call patient and there was no answer and the voicemail was full, sent patient a MyChart message advising to call [facility] at [phone] to schedule her Sinus CT.

## 2021-11-26 ENCOUNTER — Encounter: Payer: Self-pay | Admitting: Allergy and Immunology

## 2021-11-26 NOTE — Telephone Encounter (Signed)
Attempted to call patient to advise that she can call [facility] to get her Sinus CT scheduled. There was no answer and mailbox was full, will need to attempt to call again.  ?

## 2021-11-27 NOTE — Telephone Encounter (Signed)
Called and spoke with the patient and provided the phone number for scheduling her CT with [facility]. Patient verbalized understanding and will call them to get this scheduled.  ?

## 2021-12-19 ENCOUNTER — Ambulatory Visit
Admission: RE | Admit: 2021-12-19 | Discharge: 2021-12-19 | Disposition: A | Discharge status: Home / Self Care | Payer: BC Managed Care – PPO | Source: Ambulatory Visit | Attending: Allergy and Immunology | Admitting: Allergy and Immunology

## 2021-12-19 DIAGNOSIS — J329 Chronic sinusitis, unspecified: Secondary | ICD-10-CM | POA: Diagnosis not present

## 2021-12-19 DIAGNOSIS — J342 Deviated nasal septum: Secondary | ICD-10-CM | POA: Diagnosis not present

## 2021-12-19 DIAGNOSIS — J3489 Other specified disorders of nose and nasal sinuses: Secondary | ICD-10-CM | POA: Diagnosis not present

## 2021-12-22 ENCOUNTER — Other Ambulatory Visit: Payer: Self-pay

## 2021-12-22 MED ORDER — CLINDAMYCIN HCL 150 MG PO CAPS: ORAL_CAPSULE | ORAL | 0 refills | Status: DC

## 2021-12-30 ENCOUNTER — Ambulatory Visit (INDEPENDENT_AMBULATORY_CARE_PROVIDER_SITE_OTHER): Payer: BC Managed Care – PPO | Admitting: Allergy and Immunology

## 2021-12-30 ENCOUNTER — Encounter: Payer: Self-pay | Admitting: Allergy and Immunology

## 2021-12-30 VITALS — BP 100/72 | HR 96 | Temp 98.5°F | Resp 18 | Ht 65.0 in | Wt 113.4 lb

## 2021-12-30 DIAGNOSIS — K219 Gastro-esophageal reflux disease without esophagitis: Secondary | ICD-10-CM | POA: Diagnosis not present

## 2021-12-30 DIAGNOSIS — J4550 Severe persistent asthma, uncomplicated: Secondary | ICD-10-CM | POA: Diagnosis not present

## 2021-12-30 DIAGNOSIS — D7219 Other eosinophilia: Secondary | ICD-10-CM

## 2021-12-30 DIAGNOSIS — J324 Chronic pansinusitis: Secondary | ICD-10-CM | POA: Diagnosis not present

## 2021-12-30 DIAGNOSIS — J3089 Other allergic rhinitis: Secondary | ICD-10-CM | POA: Diagnosis not present

## 2021-12-30 DIAGNOSIS — J301 Allergic rhinitis due to pollen: Secondary | ICD-10-CM

## 2021-12-30 MED ORDER — BREZTRI AEROSPHERE 160-9-4.8 MCG/ACT IN AERO: 2.0000 | INHALATION_SPRAY | Freq: Two times a day (BID) | RESPIRATORY_TRACT | 5 refills | Status: DC

## 2021-12-30 MED ORDER — MEPOLIZUMAB 100 MG ~~LOC~~ SOLR
100.0000 mg | SUBCUTANEOUS | Status: AC
  Administered 2021-12-30: 100 mg via SUBCUTANEOUS

## 2021-12-30 MED ORDER — ALBUTEROL SULFATE HFA 108 (90 BASE) MCG/ACT IN AERS: 2.0000 | INHALATION_SPRAY | RESPIRATORY_TRACT | 1 refills | Status: DC | PRN

## 2021-12-30 MED ORDER — OMEPRAZOLE 40 MG PO CPDR: 40.0000 mg | DELAYED_RELEASE_CAPSULE | Freq: Every morning | ORAL | 5 refills | Status: DC

## 2021-12-30 MED ORDER — CETIRIZINE HCL 10 MG PO TABS
10.0000 mg | ORAL_TABLET | Freq: Two times a day (BID) | ORAL | 5 refills | Status: DC | PRN
Start: 2021-12-30 — End: 2023-03-10

## 2021-12-30 MED ORDER — TRIAMCINOLONE ACETONIDE 55 MCG/ACT NA AERO: 1.0000 | INHALATION_SPRAY | Freq: Two times a day (BID) | NASAL | 5 refills | Status: DC

## 2021-12-30 NOTE — Progress Notes (Signed)
? ?[place] - [employer] - [place] - [place] - [place] ? ? ?Follow-up Note ? ?Referring Provider: Lupita Raider, MD ?Primary Provider: Lupita Raider, MD ?Date of Office Visit: 12/30/2021 ? ?Subjective:  ? ?Diana Friedman (DOB: [birth year]) is a 21 y.o. female who [facility] in re-evaluation of the following: ? ?HPI: Jensine returns to this clinic in evaluation of severe asthma, allergic rhinitis, chronic sinusitis, reflux, and eosinophilia.  Her last visit to this clinic was her initial evaluation of 25 November 2021. ? ?Her head has improved significantly.  She is not stuffy anymore or has head pressure and she no longer has any ugly nasal discharge. ? ?She has resolved her wheezing and coughing and shortness of breath and does not use a short acting bronchodilator. ? ?She no longer has any heartburn. ? ?She has performed house dust avoidance measures, and has decreased her vape use by about 50%. ? ?She is tolerating the administration of clindamycin which is presently being used for her CT scan documented chronic sinusitis. ? ?Allergies as of 12/30/2021   ? ?   Reactions  ? Pineapple Nausea And Vomiting  ? Stomach pain  ? ?  ? ?  ?Medication List  ? ? ?albuterol 108 (90 Base) MCG/ACT inhaler ?Commonly known as: VENTOLIN HFA ?Inhale 2 puffs into the lungs every 6 (six) hours as needed for wheezing or shortness of breath. ?  ?Breztri Aerosphere 160-9-4.8 MCG/ACT Aero ?Generic drug: Budeson-Glycopyrrol-Formoterol ?Inhale 2 puffs into the lungs in the morning and at bedtime. ?  ?clindamycin 150 MG capsule ?Commonly known as: Cleocin ?1 capsule tid for 3 weeks ?  ?Nucala 100 MG/ML Soaj ?Generic drug: Mepolizumab ?Inject into the skin. ?  ?Olopatadine-Mometasone [id number] MCG/ACT Susp ?Place 1 spray into the nose daily as needed. ?  ?spironolactone 100 MG tablet ?Commonly known as: ALDACTONE ?Take 100 mg by mouth daily. ?  ? ?Past Medical History:  ?Diagnosis Date  ? Asthma    ? ? ?Past Surgical History:  ?Procedure Laterality Date  ? LAPAROSCOPY N/A 10/31/2021  ? Procedure: OPERATIVE LAPAROSCOPY FOR LEFT OVARIAN TORSION;  Surgeon: Waynard Reeds, MD;  Location: [facility] OR;  Service: Gynecology;  Laterality: N/A;  ? ? ?Review of systems negative except as noted in HPI / PMHx or noted below: ? ?Review of Systems  ?Constitutional: Negative.   ?HENT: Negative.    ?Eyes: Negative.   ?Respiratory: Negative.    ?Cardiovascular: Negative.   ?Gastrointestinal: Negative.   ?Genitourinary: Negative.   ?Musculoskeletal: Negative.   ?Skin: Negative.   ?Neurological: Negative.   ?Endo/Heme/Allergies: Negative.   ?Psychiatric/Behavioral: Negative.    ? ? ?Objective:  ? ?Vitals:  ? 12/30/21 0932  ?BP: 100/72  ?Pulse: 96  ?Resp: 18  ?Temp: 98.5 ?F (36.9 ?C)  ?SpO2: 100%  ? ?Height: 5\' 5"  (165.1 cm)  ?Weight: 113 lb 6.4 oz (51.4 kg)  ? ?Physical Exam ?Constitutional:   ?   Appearance: She is not diaphoretic.  ?HENT:  ?   Head: Normocephalic.  ?   Right Ear: Tympanic membrane, ear canal and external ear normal.  ?   Left Ear: Tympanic membrane, ear canal and external ear normal.  ?   Nose: Nose normal. No mucosal edema or rhinorrhea.  ?   Mouth/Throat:  ?   Pharynx: Uvula midline. No oropharyngeal exudate.  ?Eyes:  ?   Conjunctiva/sclera: Conjunctivae normal.  ?Neck:  ?   Thyroid: No thyromegaly.  ?   Trachea: Trachea normal. No  tracheal tenderness or tracheal deviation.  ?Cardiovascular:  ?   Rate and Rhythm: Normal rate and regular rhythm.  ?   Heart sounds: Normal heart sounds, S1 normal and S2 normal. No murmur heard. ?Pulmonary:  ?   Effort: No respiratory distress.  ?   Breath sounds: Normal breath sounds. No stridor. No wheezing or rales.  ?Lymphadenopathy:  ?   Head:  ?   Right side of head: No tonsillar adenopathy.  ?   Left side of head: No tonsillar adenopathy.  ?   Cervical: No cervical adenopathy.  ?Skin: ?   Findings: No erythema or rash.  ?   Nails: There is no clubbing.  ?Neurological:  ?   Mental  Status: She is alert.  ? ? ?Diagnostics:  ?  ?Spirometry was performed and demonstrated an FEV1 of 2.80 at 82 % of predicted.  Her previous FEV1 was 2.30. ? ?Results of a sinus CT scan obtained on 19 December 2021 identified the following: ?  ?Frontal: Mucosal thickening at both frontal ethmoid junction regions.  ?Ethmoid: Widespread bilateral ethmoid opacification.  ?Maxillary: Bilateral mucosal thickening with layering fluid.  ?Sphenoid: Bilateral mucosal thickening with layering fluid.  ?Right ostiomeatal unit: Obscured by regional mucosal thickening.  ?Left ostiomeatal unit: Mildly narrowed by mucosal thickening but with patency.  ?Nasal passages: Opacification of the superior nasal passages due to mucosal thickening. Inferior nasal passages are patent. Nasal septum bows 3 mm towards the left with a small spur.  ?Anatomy: Minor pneumatization superior to the left anterior ethmoid notch. Symmetric and intact olfactory grooves and fovea ethmoidalis,Keros I (1-48mm). Sellar sphenoid pneumatization pattern. No dehiscence of carotid or optic canals. No onodi cell. ? ?Assessment and Plan:  ? ?1. Not well controlled severe persistent asthma   ?2. Perennial allergic rhinitis   ?3. Seasonal allergic rhinitis due to pollen   ?4. Chronic pansinusitis   ?5. Gastroesophageal reflux disease, unspecified whether esophagitis present   ?6. Other eosinophilia   ? ? ?1.  Allergen avoidance measures - dust mite, cat, dog, pollens, molds ? ?2.  Treat and prevent inflammation: ? ?A. OTC Nasacort - 1 spray each nostril 2 times per day ?B. Breztri - 2 inhalations 2 times per day w/ spacer (empty lungs) ?C. Mepolizumab injections started today and every 4 weeks ([name]) ? ?3. Treat chronic sinus infection: ? ?A. Clindamycin 150 - 1 tablet 3 times per day for total of 3 weeks ? ?4.  Treat and prevent reflux: ? ?A. Consolidate  caffeine, chocolate, alcohol ?B. Omeprazole 40 mg - 1 tablet 1 time per day ? ?5.  Treat and prevent vape  damage: ? ?A. Slowly convert to oral nicotine ? ?6.  If needed: ? ?A. Albuterol HFA - 2 inhalations every 4-6 hours ?B. Antihistamine ?C. Nasal saline ? ?7.  Return to clinic in 8 weeks or earlier if problem. Taper medications??? ? ?Lavaya is doing much better at this point in time with a large collection of therapy directed against inflammation of her airway and antibiotic use for chronic sinusitis and therapy directed against LPR and a decrease in her vape exposure and some allergen avoidance measures.  I will see her back in this clinic at 8 weeks and by that point in time she will have had 3 injections of mepolizumab and I suspect we will be able to taper her medications at that point in time. ? ?Laurette Schimke, MD ?Allergy / Immunology ?[facility] ?

## 2021-12-30 NOTE — Progress Notes (Signed)
Patient came in for an office visit with Dr. Lucie Leather. Dr. Lucie Leather requested that patient  receive nucala sample. Patient was given Nucala in the left arm. Patient signed consent in office and was notified that she would have to come back in four weeks for her next injection. Patient expressed that she was contacted by [name] to start deliveries of Nucala and begin home injections. Patient was given [name]'s contact information for further information if questions were to arise. Patient waited in the injection room lobby for thirty minutes without issue. ?

## 2021-12-30 NOTE — Patient Instructions (Addendum)
?  1.  Allergen avoidance measures - dust mite, cat, dog, pollens, molds ? ?2.  Treat and prevent inflammation: ? ?A. OTC Nasacort - 1 spray each nostril 2 times per day ?B. Breztri - 2 inhalations 2 times per day w/ spacer (empty lungs) ?C. Mepolizumab injections started today and every 4 weeks ([name]) ? ?3. Treat chronic sinus infection: ? ?A. Clindamycin 150 - 1 tablet 3 times per day for total of 3 weeks ? ?4.  Treat and prevent reflux: ? ?A. Consolidate  caffeine, chocolate, alcohol ?B. Omeprazole 40 mg - 1 tablet 1 time per day ? ?5.  Treat and prevent vape damage: ? ?A. Slowly convert to oral nicotine ? ?6.  If needed: ? ?A. Albuterol HFA - 2 inhalations every 4-6 hours ?B. Antihistamine ?C. Nasal saline ? ?7.  Return to clinic in 8 weeks or earlier if problem. Taper medications??? ?

## 2021-12-31 ENCOUNTER — Encounter: Payer: Self-pay | Admitting: Allergy and Immunology

## 2022-01-27 ENCOUNTER — Ambulatory Visit: Payer: BC Managed Care – PPO

## 2022-01-27 DIAGNOSIS — J4550 Severe persistent asthma, uncomplicated: Secondary | ICD-10-CM

## 2022-01-27 NOTE — Progress Notes (Signed)
Immunotherapy ? ? ?Patient Details  ?Name: Diana Friedman ?MRN: [id number] ?Date of Birth: 2001/05/10 ? ?01/27/2022 ? ?Diana Friedman here to learn how to self administer Nucala for her asthma. Patient was shown how to administer and patient demonstrated correct administration. Patient will continue her injections at home. ?Frequency: every 4 weeks ?Epi-Pen:Epi-Pen Available  ?Consent signed and patient instructions given. ? ? ?Dub Mikes ?01/27/2022, 3:01 PM ? ? ?

## 2022-02-08 ENCOUNTER — Other Ambulatory Visit: Payer: Self-pay | Admitting: Allergy and Immunology

## 2022-02-27 DIAGNOSIS — R051 Acute cough: Secondary | ICD-10-CM | POA: Diagnosis not present

## 2022-02-27 DIAGNOSIS — R509 Fever, unspecified: Secondary | ICD-10-CM | POA: Diagnosis not present

## 2022-02-27 DIAGNOSIS — B338 Other specified viral diseases: Secondary | ICD-10-CM | POA: Diagnosis not present

## 2022-02-27 DIAGNOSIS — Z03818 Encounter for observation for suspected exposure to other biological agents ruled out: Secondary | ICD-10-CM | POA: Diagnosis not present

## 2022-02-27 IMAGING — CT CT RENAL STONE PROTOCOL
2 of 4 series · 16 of 46 positions shown, 18 images · non-contrast
Comparison: None.

CLINICAL DATA: Flank pain.  Concern for kidney stone.



[Series 2: stone full · axial · 0.65mm/px · z∈[-480,-95]mm · 13 of 85 slices shown, 15 images]
[im 4/85  soft-tissue]
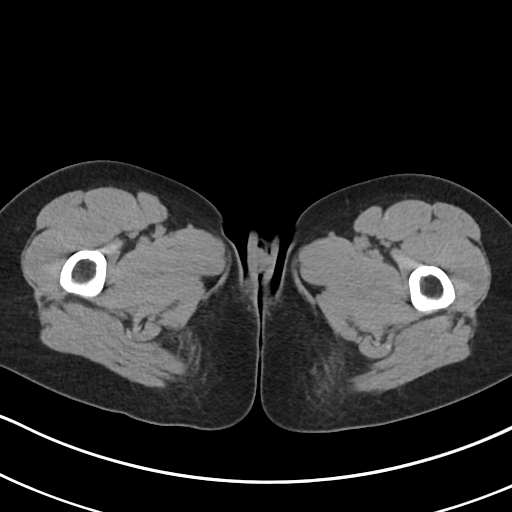
[im 4/85  bone]
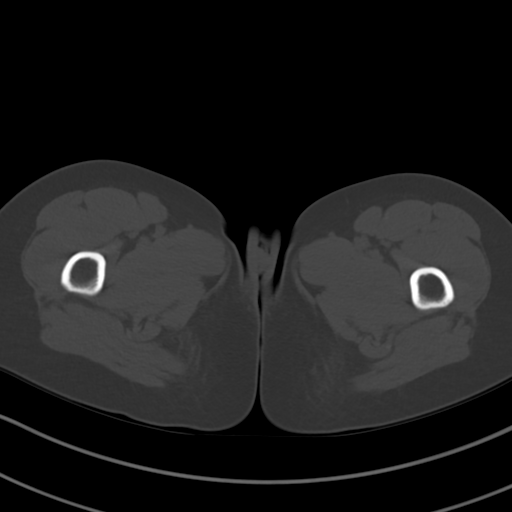
[im 11/85  soft-tissue]
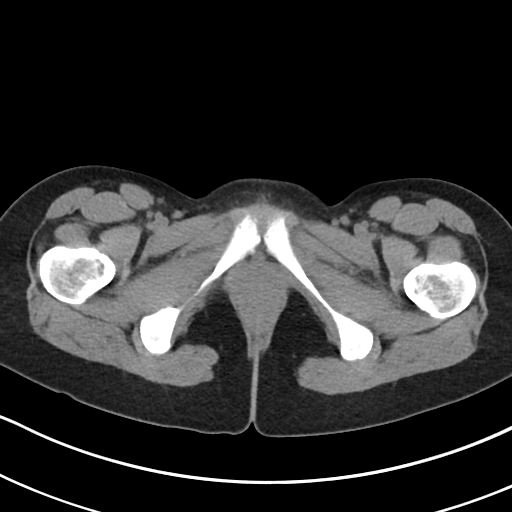
[im 17/85  soft-tissue]
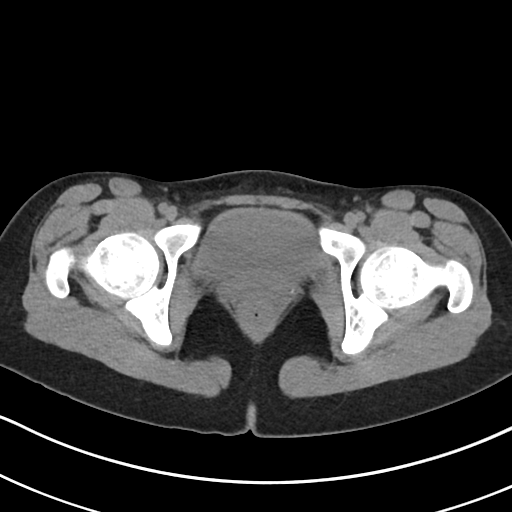
[im 24/85  soft-tissue]
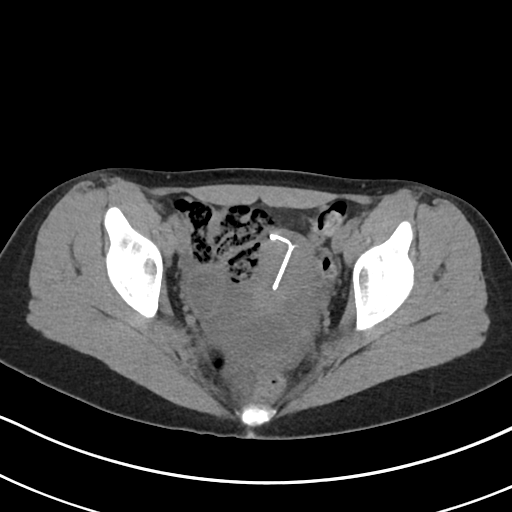
[im 31/85  soft-tissue]
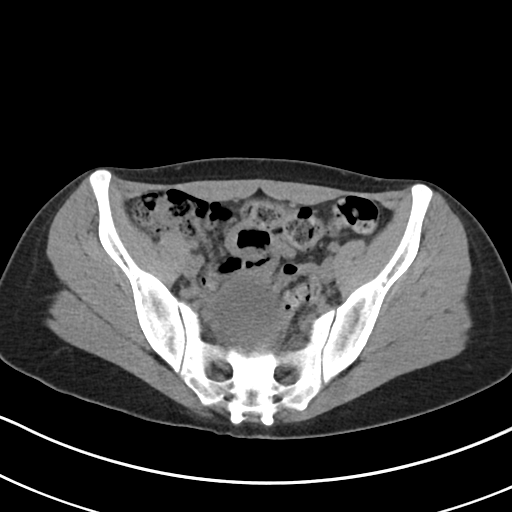
[im 37/85  soft-tissue]
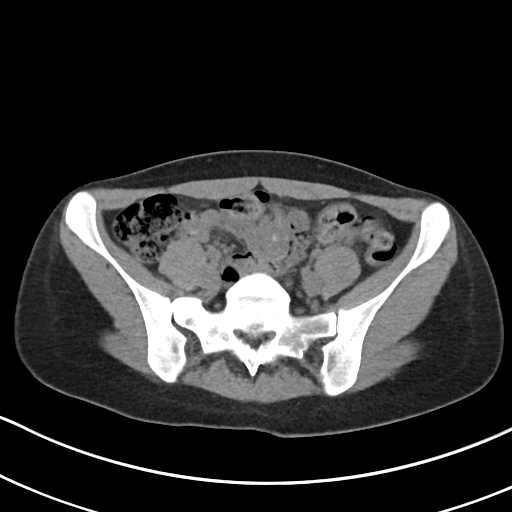
[im 44/85  soft-tissue]
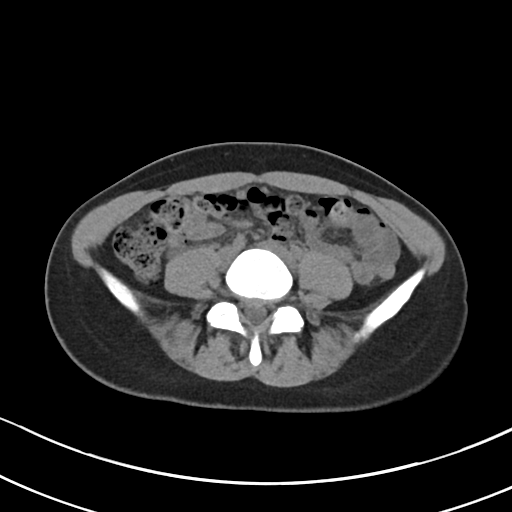
[im 48/85  soft-tissue]
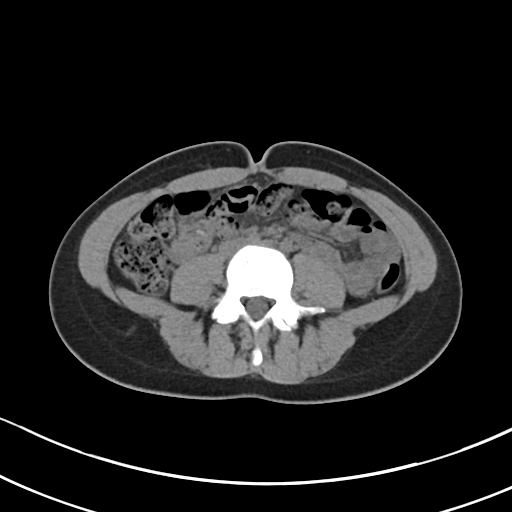
[im 54/85  soft-tissue]
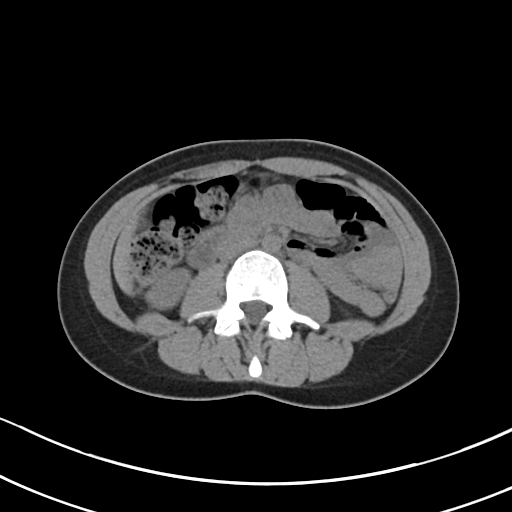
[im 54/85  bone]
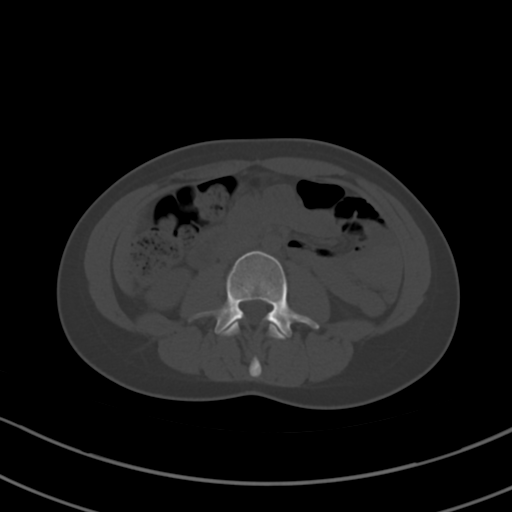
[im 61/85  soft-tissue]
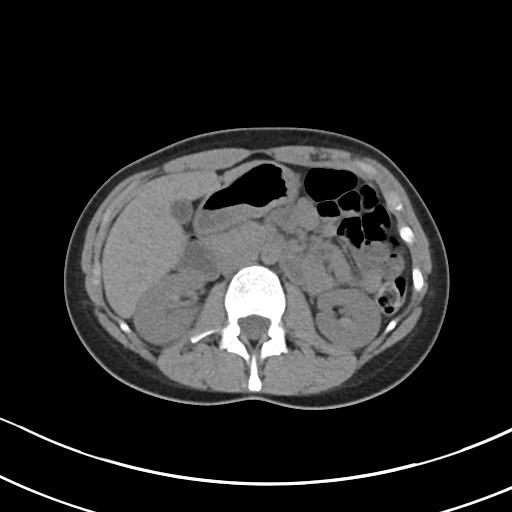
[im 68/85  soft-tissue]
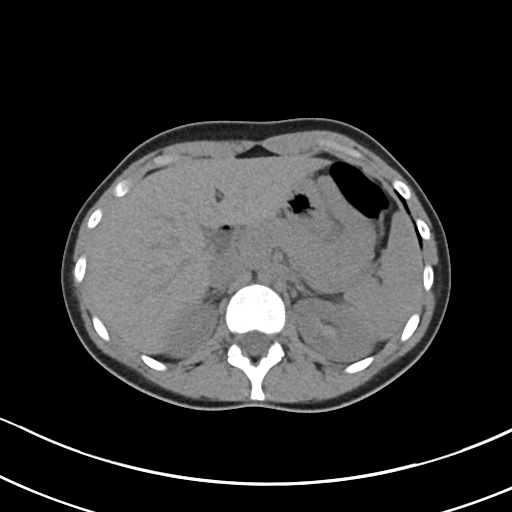
[im 74/85  soft-tissue]
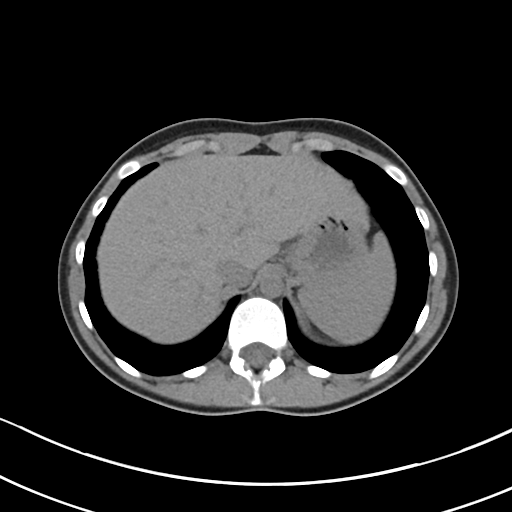
[im 81/85  soft-tissue]
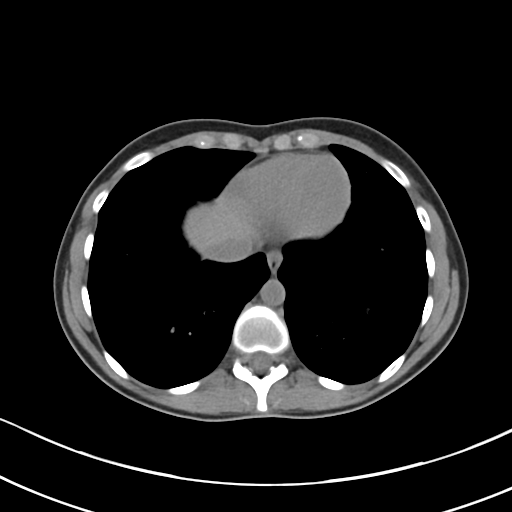

[Series 5: coronal · coronal · 0.71mm/px · 3 of 72 slices shown]
[im 24/72  soft-tissue]
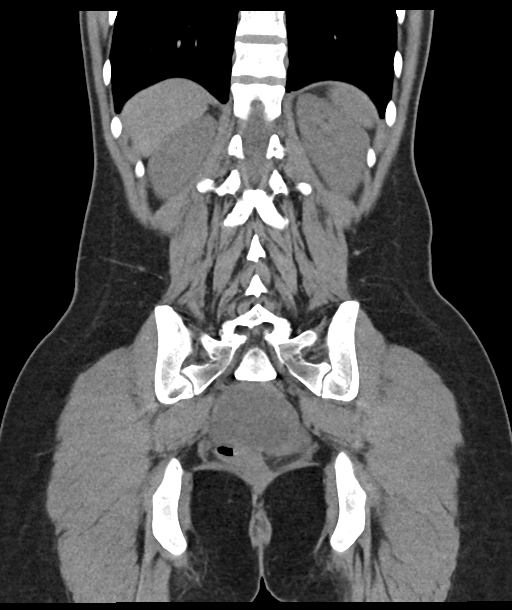
[im 32/72  soft-tissue]
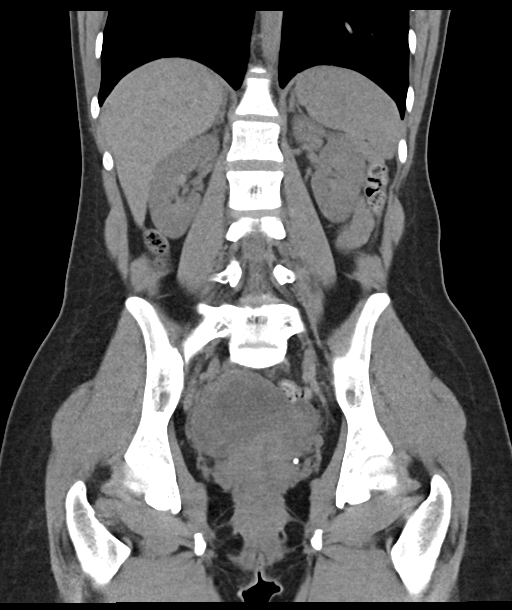
[im 40/72  soft-tissue]
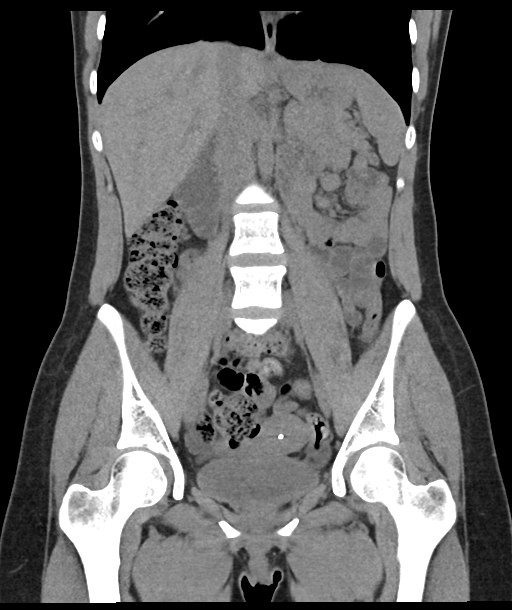

[16 of 46 positions shown; findings below may reference images not displayed]

FINDINGS: Evaluation of this exam is limited in the absence of intravenous
contrast.

Lower chest: The visualized lung bases are clear.

No intra-abdominal free air.  Small free fluid in the pelvis.

Hepatobiliary: No focal liver abnormality is seen. No gallstones,
gallbladder wall thickening, or biliary dilatation.

Pancreas: Unremarkable. No pancreatic ductal dilatation or
surrounding inflammatory changes.

Spleen: Normal in size without focal abnormality.

Adrenals/Urinary Tract: The adrenal glands are unremarkable. The
kidneys, visualized ureters, and urinary bladder appear
unremarkable.

Stomach/Bowel: Moderate stool throughout the colon. No bowel
obstruction or active inflammation. No evidence of acute
appendicitis.

Vascular/Lymphatic: The abdominal aorta and IVC are grossly
unremarkable on this noncontrast CT. No portal venous gas. There is
no adenopathy.

Reproductive: The uterus is anteverted. An intrauterine device is
noted. Ill-defined multi-septated cystic structure in the posterior
pelvis measuring approximately 6 x 8 cm. This may represent a
complex ovarian cyst. However, a pelvic inflammatory disease or
tubo-ovarian abscess is not entirely excluded. Clinical correlation
and further evaluation with pelvic ultrasound recommended.

Other: None

Musculoskeletal: No acute or significant osseous findings.
IMPRESSION: 1. No hydronephrosis or nephrolithiasis.
2. Ill-defined multi-septated cystic structure in the posterior
pelvis. Further evaluation with pelvic ultrasound recommended.
3. Constipation.  No bowel obstruction.

## 2022-03-02 ENCOUNTER — Telehealth: Payer: Self-pay | Admitting: Allergy and Immunology

## 2022-03-02 NOTE — Telephone Encounter (Signed)
Patient has RSV. Dad would like to know since patient has RSV & now a weakened immune system - would it be okay for patient to overseas? Would it be risky to send her? Patient is set to leave 03/17/22. Dad states he will not send her if she is still sick.   Dad would like a call back: [phone]  Please advise

## 2022-03-05 DIAGNOSIS — R059 Cough, unspecified: Secondary | ICD-10-CM | POA: Diagnosis not present

## 2022-03-05 DIAGNOSIS — J988 Other specified respiratory disorders: Secondary | ICD-10-CM | POA: Diagnosis not present

## 2022-03-05 DIAGNOSIS — B974 Respiratory syncytial virus as the cause of diseases classified elsewhere: Secondary | ICD-10-CM | POA: Diagnosis not present

## 2022-03-09 ENCOUNTER — Encounter: Payer: Self-pay | Admitting: Allergy and Immunology

## 2022-03-09 ENCOUNTER — Ambulatory Visit (INDEPENDENT_AMBULATORY_CARE_PROVIDER_SITE_OTHER): Payer: BC Managed Care – PPO | Admitting: Allergy and Immunology

## 2022-03-09 VITALS — BP 98/62 | HR 72 | Resp 14

## 2022-03-09 DIAGNOSIS — J301 Allergic rhinitis due to pollen: Secondary | ICD-10-CM

## 2022-03-09 DIAGNOSIS — K219 Gastro-esophageal reflux disease without esophagitis: Secondary | ICD-10-CM

## 2022-03-09 DIAGNOSIS — J4550 Severe persistent asthma, uncomplicated: Secondary | ICD-10-CM | POA: Diagnosis not present

## 2022-03-09 DIAGNOSIS — J3089 Other allergic rhinitis: Secondary | ICD-10-CM | POA: Diagnosis not present

## 2022-03-09 DIAGNOSIS — J324 Chronic pansinusitis: Secondary | ICD-10-CM

## 2022-03-09 MED ORDER — ALBUTEROL SULFATE HFA 108 (90 BASE) MCG/ACT IN AERS: 2.0000 | INHALATION_SPRAY | RESPIRATORY_TRACT | 1 refills | Status: DC | PRN

## 2022-03-09 NOTE — Patient Instructions (Signed)
  1.  Allergen avoidance measures - dust mite, cat, dog, pollens, molds  2.  Treat and prevent inflammation:  A. OTC Nasacort - 1 spray each nostril 2 times per day B. Breztri - 2 inhalations 2 times per day w/ spacer (empty lungs) C. Mepolizumab injections every 4 weeks    3.  Treat and prevent reflux:  A. Consolidate  caffeine, chocolate, alcohol B. Omeprazole 40 mg - 1 tablet 1 time per day (for all of 2023)  4. If needed:  A. Albuterol HFA - 2 inhalations every 4-6 hours B. Antihistamine C. Nasal saline  5.  Return to clinic in 12 weeks or earlier if problem.

## 2022-03-09 NOTE — Progress Notes (Signed)
[place] - [place] - [place] - [place] - [place]   Follow-up Note  Referring Provider: Lupita Raider, MD Primary Provider: Lupita Raider, MD Date of Office Visit: 03/09/2022  Subjective:   Diana Friedman (DOB: May 13, 2001) is a 21 y.o. female who [facility]22 in re-evaluation of the following:  HPI: Diana Friedman returns to this clinic in evaluation of severe asthma, allergic rhinitis, chronic sinusitis, reflux, and eosinophilia.  Her last visit to this clinic was 30 December 2021.  She was doing wonderful with all of her airway issues until she contracted RSV on 24 Feb 2022 at which point in time she had sore throat and then nasal symptoms and then coughing for which she was given prednisone on the eighth day of this issue which she is currently using.  She did not have a high fever or ugly nasal discharge or redevelopment of anosmia and overall is doing somewhat better at this point.  Prior to that event she was doing very well and did not have any issues with her head or her chest and did not require short acting bronchodilator and she was not having any issues with heartburn.  She no longer vapes.  Allergies as of 03/09/2022       Reactions   Pineapple Nausea And Vomiting   Stomach pain        Medication List    albuterol 108 (90 Base) MCG/ACT inhaler Commonly known as: VENTOLIN HFA Inhale 2 puffs into the lungs every 4 (four) hours as needed for wheezing or shortness of breath.   Breztri Aerosphere 160-9-4.8 MCG/ACT Aero Generic drug: Budeson-Glycopyrrol-Formoterol Inhale 2 puffs into the lungs in the morning and at bedtime.   cetirizine 10 MG tablet Commonly known as: ZYRTEC Take 1 tablet (10 mg total) by mouth 2 (two) times daily as needed for allergies (Can take an extra dose during flare ups.).   Nucala 100 MG/ML Soaj Generic drug: Mepolizumab Inject into the skin.   Olopatadine-Mometasone 244-62 MCG/ACT Susp Place  1 spray into the nose daily as needed.   omeprazole 40 MG capsule Commonly known as: PRILOSEC Take 1 capsule (40 mg total) by mouth in the morning.   spironolactone 100 MG tablet Commonly known as: ALDACTONE Take 100 mg by mouth daily.   triamcinolone 55 MCG/ACT Aero nasal inhaler Commonly known as: NASACORT Place 1 spray into the nose 2 (two) times daily.    Past Medical History:  Diagnosis Date   Asthma     Past Surgical History:  Procedure Laterality Date   LAPAROSCOPY N/A 10/31/2021   Procedure: OPERATIVE LAPAROSCOPY FOR LEFT OVARIAN TORSION;  Surgeon: Waynard Reeds, MD;  Location: [facility] OR;  Service: Gynecology;  Laterality: N/A;    Review of systems negative except as noted in HPI / PMHx or noted below:  Review of Systems  Constitutional: Negative.   HENT: Negative.    Eyes: Negative.   Respiratory: Negative.    Cardiovascular: Negative.   Gastrointestinal: Negative.   Genitourinary: Negative.   Musculoskeletal: Negative.   Skin: Negative.   Neurological: Negative.   Endo/Heme/Allergies: Negative.   Psychiatric/Behavioral: Negative.       Objective:   Vitals:   03/09/22 1140  BP: 98/62  Pulse: 72  Resp: 14  SpO2: 99%          Physical Exam Constitutional:      Appearance: She is not diaphoretic.  HENT:     Head: Normocephalic.     Right  Ear: Tympanic membrane, ear canal and external ear normal.     Left Ear: Tympanic membrane, ear canal and external ear normal.     Nose: Nose normal. No mucosal edema or rhinorrhea.     Mouth/Throat:     Pharynx: Uvula midline. No oropharyngeal exudate.  Eyes:     Conjunctiva/sclera: Conjunctivae normal.  Neck:     Thyroid: No thyromegaly.     Trachea: Trachea normal. No tracheal tenderness or tracheal deviation.  Cardiovascular:     Rate and Rhythm: Normal rate and regular rhythm.     Heart sounds: Normal heart sounds, S1 normal and S2 normal. No murmur heard. Pulmonary:     Effort: No respiratory distress.      Breath sounds: Normal breath sounds. No stridor. No wheezing or rales.  Lymphadenopathy:     Head:     Right side of head: No tonsillar adenopathy.     Left side of head: No tonsillar adenopathy.     Cervical: No cervical adenopathy.  Skin:    Findings: No erythema or rash.     Nails: There is no clubbing.  Neurological:     Mental Status: She is alert.     Diagnostics:    Spirometry was performed and demonstrated an FEV1 of 2.71 at 79 % of predicted.  Assessment and Plan:   1. Not well controlled severe persistent asthma   2. Perennial allergic rhinitis   3. Seasonal allergic rhinitis due to pollen   4. Chronic pansinusitis   5. Gastroesophageal reflux disease, unspecified whether esophagitis present     1.  Allergen avoidance measures - dust mite, cat, dog, pollens, molds  2.  Treat and prevent inflammation:  A. OTC Nasacort - 1 spray each nostril 2 times per day B. Breztri - 2 inhalations 2 times per day w/ spacer (empty lungs) C. Mepolizumab injections every 4 weeks    3.  Treat and prevent reflux:  A. Consolidate  caffeine, chocolate, alcohol B. Omeprazole 40 mg - 1 tablet 1 time per day (for all of 2023)  4. If needed:  A. Albuterol HFA - 2 inhalations every 4-6 hours B. Antihistamine C. Nasal saline  5.  Return to clinic in 12 weeks or earlier if problem.    Tyshika unfortunately contracted RSV and it is giving rise to significant inflammation of her airway and she is going to have this issue for several weeks.  Hopefully she will not roll back into her chronic sinusitis state that was in existence for a prolonged period of time until just recently.  She is going to continue on anti-inflammatory agents for her airway including mepolizumab.  She has some desire to stop some of her medicines and she has altered the use of her omeprazole to just as needed use but I have asked her to stay on her omeprazole at least for all of 2023.  I will see her back in this  clinic in 12 weeks or earlier if there is a problem.  Laurette Schimke, MD Allergy / Immunology [facility]

## 2022-03-10 ENCOUNTER — Encounter: Payer: Self-pay | Admitting: Allergy and Immunology

## 2022-03-24 ENCOUNTER — Ambulatory Visit: Payer: BC Managed Care – PPO | Admitting: Allergy and Immunology

## 2022-05-21 DIAGNOSIS — Z01419 Encounter for gynecological examination (general) (routine) without abnormal findings: Secondary | ICD-10-CM | POA: Diagnosis not present

## 2022-05-21 DIAGNOSIS — Z6820 Body mass index (BMI) 20.0-20.9, adult: Secondary | ICD-10-CM | POA: Diagnosis not present

## 2022-05-21 DIAGNOSIS — Z113 Encounter for screening for infections with a predominantly sexual mode of transmission: Secondary | ICD-10-CM | POA: Diagnosis not present

## 2022-06-02 ENCOUNTER — Ambulatory Visit: Payer: BC Managed Care – PPO | Admitting: Allergy and Immunology

## 2022-06-25 DIAGNOSIS — L700 Acne vulgaris: Secondary | ICD-10-CM | POA: Diagnosis not present

## 2022-07-07 ENCOUNTER — Ambulatory Visit (INDEPENDENT_AMBULATORY_CARE_PROVIDER_SITE_OTHER): Payer: BC Managed Care – PPO | Admitting: Allergy and Immunology

## 2022-07-07 VITALS — BP 114/68 | HR 88 | Temp 98.1°F | Resp 20 | Ht 64.0 in | Wt 123.8 lb

## 2022-07-07 DIAGNOSIS — K219 Gastro-esophageal reflux disease without esophagitis: Secondary | ICD-10-CM | POA: Diagnosis not present

## 2022-07-07 DIAGNOSIS — J3089 Other allergic rhinitis: Secondary | ICD-10-CM

## 2022-07-07 DIAGNOSIS — J301 Allergic rhinitis due to pollen: Secondary | ICD-10-CM

## 2022-07-07 DIAGNOSIS — J4550 Severe persistent asthma, uncomplicated: Secondary | ICD-10-CM | POA: Diagnosis not present

## 2022-07-07 MED ORDER — OMEPRAZOLE 40 MG PO CPDR: 40.0000 mg | DELAYED_RELEASE_CAPSULE | Freq: Every morning | ORAL | 5 refills | Status: DC

## 2022-07-07 MED ORDER — BREZTRI AEROSPHERE 160-9-4.8 MCG/ACT IN AERO: 2.0000 | INHALATION_SPRAY | Freq: Two times a day (BID) | RESPIRATORY_TRACT | 5 refills | Status: DC

## 2022-07-07 MED ORDER — LEVOCETIRIZINE DIHYDROCHLORIDE 5 MG PO TABS: 5.0000 mg | ORAL_TABLET | Freq: Every day | ORAL | 5 refills | Status: AC | PRN

## 2022-07-07 MED ORDER — ALBUTEROL SULFATE HFA 108 (90 BASE) MCG/ACT IN AERS: 2.0000 | INHALATION_SPRAY | RESPIRATORY_TRACT | 1 refills | Status: AC | PRN

## 2022-07-07 MED ORDER — TRIAMCINOLONE ACETONIDE 55 MCG/ACT NA AERO
1.0000 | INHALATION_SPRAY | Freq: Two times a day (BID) | NASAL | 5 refills | Status: AC
Start: 2022-07-07 — End: ?

## 2022-07-07 NOTE — Progress Notes (Unsigned)
[place]   Follow-up Note  Referring Provider: Mayra Neer, MD Primary Provider: Mayra Neer, MD Date of Office Visit: 07/07/2022  Subjective:   Diana Friedman (DOB: Jul 26, 2001) is a 21 y.o. female who returns to the [facility] on 07/07/2022 in re-evaluation of the following:  HPI: Diana Friedman returns to this clinic in evaluation of severe asthma, allergic rhinitis, chronic sinusitis, reflux, and eosinophilia.  Her last visit to this clinic was 09 March 2022.  Her asthma has been nonexistent on her current plan of a anti-IL-5 biologic agent along with a triple inhaler utilized twice a day on a consistent basis.  She can exercise without any difficulty and does not use a short acting bronchodilator and has not required a systemic steroid.  She has had no problems with her nose and rarely uses any nasal steroid and has not required antibiotic treating episode of sinusitis.  She continues on omeprazole for her reflux which is working just great.  Her plan is to use omeprazole every day for 2023 and then attempt to taper.  She had a history of vaping but she has not vaped in the past 4 months.  Allergies as of 07/07/2022       Reactions   Pineapple Nausea And Vomiting   Stomach pain        Medication List    albuterol 108 (90 Base) MCG/ACT inhaler Commonly known as: VENTOLIN HFA Inhale 2 puffs into the lungs every 4 (four) hours as needed for wheezing or shortness of breath.   Breztri Aerosphere 160-9-4.8 MCG/ACT Aero Generic drug: Budeson-Glycopyrrol-Formoterol Inhale 2 puffs into the lungs in the morning and at bedtime.   cetirizine 10 MG tablet Commonly known as: ZYRTEC Take 1 tablet (10 mg total) by mouth 2 (two) times daily as needed for allergies (Can take an extra dose during flare ups.).   Nucala 100 MG/ML Soaj Generic drug: Mepolizumab Inject into the skin.   Olopatadine-Mometasone 737-10  MCG/ACT Susp Place 1 spray into the nose daily as needed.   omeprazole 40 MG capsule Commonly known as: PRILOSEC Take 1 capsule (40 mg total) by mouth in the morning.   spironolactone 100 MG tablet Commonly known as: ALDACTONE Take 100 mg by mouth daily.   triamcinolone 55 MCG/ACT Aero nasal inhaler Commonly known as: NASACORT Place 1 spray into the nose 2 (two) times daily.        Past Medical History:  Diagnosis Date   Asthma     Past Surgical History:  Procedure Laterality Date   LAPAROSCOPY N/A 10/31/2021   Procedure: OPERATIVE LAPAROSCOPY FOR LEFT OVARIAN TORSION;  Surgeon: [name], MD;  Location: [place];  Service: Gynecology;  Laterality: N/A;    Review of systems negative except as noted in HPI / PMHx or noted below:  Review of Systems  Constitutional: Negative.   HENT: Negative.    Eyes: Negative.   Respiratory: Negative.    Cardiovascular: Negative.   Gastrointestinal: Negative.   Genitourinary: Negative.   Musculoskeletal: Negative.   Skin: Negative.   Neurological: Negative.   Endo/Heme/Allergies: Negative.   Psychiatric/Behavioral: Negative.       Objective:   Vitals:   07/07/22 1626  BP: 114/68  Pulse: 88  Resp: 20  SpO2: 98%   Height: 5\' 4"  (162.6 cm)      Physical Exam Constitutional:      Appearance: She is not diaphoretic.  HENT:     Head:  Normocephalic.     Right Ear: Tympanic membrane, ear canal and external ear normal.     Left Ear: Tympanic membrane, ear canal and external ear normal.     Nose: Nose normal. No mucosal edema or rhinorrhea.     Mouth/Throat:     Pharynx: Uvula midline. No oropharyngeal exudate.  Eyes:     Conjunctiva/sclera: Conjunctivae normal.  Neck:     Thyroid: No thyromegaly.     Trachea: Trachea normal. No tracheal tenderness or tracheal deviation.  Cardiovascular:     Rate and Rhythm: Normal rate and regular rhythm.     Heart sounds: Normal heart sounds, S1 normal and S2 normal. No murmur  heard. Pulmonary:     Effort: No respiratory distress.     Breath sounds: Normal breath sounds. No stridor. No wheezing or rales.  Lymphadenopathy:     Head:     Right side of head: No tonsillar adenopathy.     Left side of head: No tonsillar adenopathy.     Cervical: No cervical adenopathy.  Skin:    Findings: No erythema or rash.     Nails: There is no clubbing.  Neurological:     Mental Status: She is alert.     Diagnostics:    Spirometry was performed and demonstrated an FEV1 of 2.89 at 94 % of predicted.   Assessment and Plan:   1. Asthma, severe persistent, well-controlled   2. Perennial allergic rhinitis   3. Seasonal allergic rhinitis due to pollen   4. Gastroesophageal reflux disease, unspecified whether esophagitis present    1.  Allergen avoidance measures - dust mite, cat, dog, pollens, molds  2.  Treat and prevent inflammation:  A. OTC Nasacort - 1 spray each nostril 3-7 times per week B. Breztri - 2 inhalations 1-2 times per day w/ spacer (empty lungs) C. Mepolizumab injections every 4 weeks    3.  Treat and prevent reflux:  A. Consolidate  caffeine, chocolate, alcohol B. Omeprazole 40 mg - 1 tablet 3-7 times per week (after 2023)  4. If needed:  A. Albuterol HFA - 2 inhalations every 4-6 hours B. Antihistamine C. Nasal saline  5.  Return to clinic in 6 months or earlier if problem.    6.  Obtain fall flu vaccine  Diana Friedman is doing wonderful on her current plan which includes an anti-IL-5 biologic agent.  There may be an opportunity to consolidate some of her therapy and I asked her to try to taper her Diana Friedman to just 1 time per day.  As well, she will continue to aggressively treat her reflux which was quite significant earlier this year utilizing omeprazole once a day for the rest of 2023 but after that point in time she can attempt to taper that agent to just a few times per week.  Assuming she does well I will see her back in this clinic in 6 months  or earlier if there is a problem.  Diana Katz, MD Allergy / Immunology [place]

## 2022-07-07 NOTE — Patient Instructions (Signed)
  1.  Allergen avoidance measures - dust mite, cat, dog, pollens, molds  2.  Treat and prevent inflammation:  A. OTC Nasacort - 1 spray each nostril 3-7 times per week B. Breztri - 2 inhalations 1-2 times per day w/ spacer (empty lungs) C. Mepolizumab injections every 4 weeks    3.  Treat and prevent reflux:  A. Consolidate  caffeine, chocolate, alcohol B. Omeprazole 40 mg - 1 tablet 3-7 times per week (after 2023)  4. If needed:  A. Albuterol HFA - 2 inhalations every 4-6 hours B. Antihistamine C. Nasal saline  5.  Return to clinic in 6 months or earlier if problem.    6.  Obtain fall flu vaccine

## 2022-07-08 ENCOUNTER — Encounter: Payer: Self-pay | Admitting: Allergy and Immunology

## 2022-07-29 ENCOUNTER — Other Ambulatory Visit (HOSPITAL_COMMUNITY): Payer: Self-pay

## 2022-08-03 ENCOUNTER — Other Ambulatory Visit (HOSPITAL_COMMUNITY): Payer: Self-pay

## 2022-08-03 DIAGNOSIS — Z0000 Encounter for general adult medical examination without abnormal findings: Secondary | ICD-10-CM | POA: Diagnosis not present

## 2022-08-05 ENCOUNTER — Other Ambulatory Visit (HOSPITAL_COMMUNITY): Payer: Self-pay

## 2022-08-05 ENCOUNTER — Telehealth: Payer: Self-pay | Admitting: *Deleted

## 2022-08-05 MED ORDER — NUCALA 100 MG/ML ~~LOC~~ SOAJ
100.0000 mg | SUBCUTANEOUS | 11 refills | Status: DC
  Filled 2022-08-05 – 2022-08-21 (×2): qty 1, 28d supply, fill #0
  Filled 2022-09-17: qty 1, 28d supply, fill #1
  Filled 2022-10-22: qty 1, 28d supply, fill #2
  Filled 2022-11-24: qty 1, 28d supply, fill #3
  Filled 2023-01-01: qty 1, 28d supply, fill #4
  Filled 2023-01-25: qty 1, 28d supply, fill #5
  Filled 2023-03-04: qty 1, 28d supply, fill #6
  Filled 2023-03-31: qty 1, 28d supply, fill #7
  Filled 2023-04-27: qty 1, 28d supply, fill #8
  Filled 2023-05-28: qty 1, 28d supply, fill #9
  Filled 2023-06-23: qty 1, 28d supply, fill #10
  Filled 2023-07-21: qty 1, 28d supply, fill #11

## 2022-08-05 NOTE — Telephone Encounter (Signed)
Called patient to advise per [employer] she will need to use [name] for her Nucala and Rx sent to them

## 2022-08-19 ENCOUNTER — Other Ambulatory Visit (HOSPITAL_COMMUNITY): Payer: Self-pay

## 2022-08-21 ENCOUNTER — Other Ambulatory Visit (HOSPITAL_COMMUNITY): Payer: Self-pay

## 2022-08-27 ENCOUNTER — Other Ambulatory Visit (HOSPITAL_COMMUNITY): Payer: Self-pay

## 2022-09-15 ENCOUNTER — Other Ambulatory Visit (HOSPITAL_COMMUNITY): Payer: Self-pay

## 2022-09-17 ENCOUNTER — Other Ambulatory Visit: Payer: Self-pay

## 2022-09-30 ENCOUNTER — Other Ambulatory Visit: Payer: Self-pay

## 2022-09-30 ENCOUNTER — Other Ambulatory Visit (HOSPITAL_COMMUNITY): Payer: Self-pay

## 2022-10-01 ENCOUNTER — Other Ambulatory Visit (HOSPITAL_COMMUNITY): Payer: Self-pay

## 2022-10-01 ENCOUNTER — Other Ambulatory Visit: Payer: Self-pay

## 2022-10-21 ENCOUNTER — Other Ambulatory Visit (HOSPITAL_COMMUNITY): Payer: Self-pay

## 2022-10-22 ENCOUNTER — Other Ambulatory Visit (HOSPITAL_COMMUNITY): Payer: Self-pay

## 2022-10-27 ENCOUNTER — Other Ambulatory Visit: Payer: Self-pay

## 2022-11-05 DIAGNOSIS — L700 Acne vulgaris: Secondary | ICD-10-CM | POA: Diagnosis not present

## 2022-11-24 ENCOUNTER — Other Ambulatory Visit (HOSPITAL_COMMUNITY): Payer: Self-pay

## 2022-11-25 ENCOUNTER — Other Ambulatory Visit (HOSPITAL_COMMUNITY): Payer: Self-pay

## 2022-12-22 ENCOUNTER — Other Ambulatory Visit (HOSPITAL_COMMUNITY): Payer: Self-pay

## 2022-12-25 ENCOUNTER — Other Ambulatory Visit (HOSPITAL_COMMUNITY): Payer: Self-pay

## 2022-12-28 ENCOUNTER — Other Ambulatory Visit (HOSPITAL_COMMUNITY): Payer: Self-pay

## 2023-01-01 ENCOUNTER — Other Ambulatory Visit (HOSPITAL_COMMUNITY): Payer: Self-pay

## 2023-01-07 ENCOUNTER — Telehealth: Payer: Self-pay

## 2023-01-07 NOTE — Telephone Encounter (Signed)
Patient's dad called stating he received a letter in the mail stating the patients nucala copay card was getting ready to expire. Dad would like a call back @ [phone] Gerilyn Pilgrim).  Thanks

## 2023-01-08 ENCOUNTER — Other Ambulatory Visit: Payer: Self-pay

## 2023-01-11 ENCOUNTER — Other Ambulatory Visit: Payer: Self-pay

## 2023-01-12 ENCOUNTER — Ambulatory Visit: Payer: BC Managed Care – PPO | Admitting: Allergy and Immunology

## 2023-01-13 ENCOUNTER — Other Ambulatory Visit: Payer: Self-pay

## 2023-01-21 NOTE — Telephone Encounter (Signed)
Tried to reach father to advise that patient out of copay card but unable to leave message VM full. Patient will need MD visit and we can discuss changing to another biologic

## 2023-01-25 ENCOUNTER — Other Ambulatory Visit: Payer: Self-pay

## 2023-01-27 ENCOUNTER — Other Ambulatory Visit (HOSPITAL_COMMUNITY): Payer: Self-pay

## 2023-02-09 ENCOUNTER — Ambulatory Visit: Payer: BC Managed Care – PPO | Admitting: Allergy and Immunology

## 2023-02-19 ENCOUNTER — Other Ambulatory Visit (HOSPITAL_COMMUNITY): Payer: Self-pay

## 2023-02-25 ENCOUNTER — Other Ambulatory Visit (HOSPITAL_COMMUNITY): Payer: Self-pay

## 2023-03-01 ENCOUNTER — Other Ambulatory Visit (HOSPITAL_COMMUNITY): Payer: Self-pay

## 2023-03-04 ENCOUNTER — Other Ambulatory Visit (HOSPITAL_COMMUNITY): Payer: Self-pay

## 2023-03-08 ENCOUNTER — Other Ambulatory Visit: Payer: Self-pay

## 2023-03-10 ENCOUNTER — Other Ambulatory Visit: Payer: Self-pay | Admitting: Allergy and Immunology

## 2023-03-10 ENCOUNTER — Ambulatory Visit (INDEPENDENT_AMBULATORY_CARE_PROVIDER_SITE_OTHER): Payer: BC Managed Care – PPO | Admitting: Allergy and Immunology

## 2023-03-10 ENCOUNTER — Encounter: Payer: Self-pay | Admitting: Allergy and Immunology

## 2023-03-10 VITALS — BP 108/62 | HR 80 | Resp 16 | Ht 64.0 in | Wt 112.8 lb

## 2023-03-10 DIAGNOSIS — J301 Allergic rhinitis due to pollen: Secondary | ICD-10-CM

## 2023-03-10 DIAGNOSIS — J3089 Other allergic rhinitis: Secondary | ICD-10-CM | POA: Diagnosis not present

## 2023-03-10 DIAGNOSIS — J4550 Severe persistent asthma, uncomplicated: Secondary | ICD-10-CM

## 2023-03-10 DIAGNOSIS — D7219 Other eosinophilia: Secondary | ICD-10-CM

## 2023-03-10 MED ORDER — AIRSUPRA 90-80 MCG/ACT IN AERO: 2.0000 | INHALATION_SPRAY | RESPIRATORY_TRACT | 1 refills | Status: DC | PRN

## 2023-03-10 NOTE — Telephone Encounter (Signed)
Patient came in for office visit. She would like an update on the copay call. Patient is requesting a call back. Thanks

## 2023-03-10 NOTE — Patient Instructions (Signed)
  1.  Allergen avoidance measures - dust mite, cat, dog, pollens, molds  2.  Treat and prevent inflammation:  A. OTC Nasacort - 1 spray each nostril 1-7 times per week B. Breztri - 2 inhalations 1-2 times per day w/ spacer (empty lungs) C. Mepolizumab injections every 4 weeks    3. If needed:  A. AIRSUPRA - 2 inhalations every 4-6 hours (replaces albuterol) B. Antihistamine C. Nasal saline  4.  Return to clinic in 12 months or earlier if problem.    5.  Plan for fall flu vaccine

## 2023-03-10 NOTE — Progress Notes (Signed)
[place] - [place] - [place] - [place]   Follow-up Note  Referring Provider: Lupita Raider, MD Primary Provider: Lupita Raider, MD Date of Office Visit: 03/10/2023  Subjective:   Diana Friedman (DOB: June 19, 2001) is a 22 y.o. female who [facility] in re-evaluation of the following:  HPI: Diana Friedman presents to this clinic in evaluation of severe asthma, allergic rhinitis, chronic sinusitis, eosinophilia, and history of reflux.  I last saw her in this clinic 09 March 2022.  Her asthma has been under excellent control.  Diana Friedman has not required a systemic steroid to treat an exacerbation.  Diana Friedman rarely uses a short acting bronchodilator and can exert herself without any problem.  Diana Friedman continues on Mepolizumab injections every 4 weeks and uses Breztri just 1 time per day at this point.  Diana Friedman does notice that at the nadir of her Mepolizumab injections Diana Friedman will sometimes develop a little bit of wheezing and interestingly also some nasal congestion which quickly abates when Diana Friedman gets her renewal injection.  Diana Friedman uses her nasal steroid rarely as Diana Friedman most of her upper airway issues have abated.  Diana Friedman has not required an antibiotic to treat an episode of sinusitis.  Her reflux has completely abated.  Allergies as of 03/10/2023       Reactions   Pineapple Nausea And Vomiting   Stomach pain        Medication List    albuterol 108 (90 Base) MCG/ACT inhaler Commonly known as: VENTOLIN HFA Inhale 2 puffs into the lungs every 4 (four) hours as needed for wheezing or shortness of breath.   Breztri Aerosphere 160-9-4.8 MCG/ACT Aero Generic drug: Budeson-Glycopyrrol-Formoterol Inhale 2 puffs into the lungs in the morning and at bedtime.   levocetirizine 5 MG tablet Commonly known as: XYZAL Take 1 tablet (5 mg total) by mouth daily as needed for allergies (Can take an extra dose during flare ups.).   Mirena (52 MG) 20 MCG/DAY Iud Generic  drug: levonorgestrel Take 1 device by intrauterine route.   Nucala 100 MG/ML Soaj Generic drug: Mepolizumab Inject 1 mL (100 mg total) into the skin every 28 (twenty-eight) days.   spironolactone 100 MG tablet Commonly known as: ALDACTONE Take 100 mg by mouth daily.   triamcinolone 55 MCG/ACT Aero nasal inhaler Commonly known as: NASACORT Place 1 spray into the nose 2 (two) times daily.    Past Medical History:  Diagnosis Date   Asthma     Past Surgical History:  Procedure Laterality Date   LAPAROSCOPY N/A 10/31/2021   Procedure: OPERATIVE LAPAROSCOPY FOR LEFT OVARIAN TORSION;  Surgeon: Waynard Reeds, MD;  Location: [facility] OR;  Service: Gynecology;  Laterality: N/A;    Review of systems negative except as noted in HPI / PMHx or noted below:  Review of Systems  Constitutional: Negative.   HENT: Negative.    Eyes: Negative.   Respiratory: Negative.    Cardiovascular: Negative.   Gastrointestinal: Negative.   Genitourinary: Negative.   Musculoskeletal: Negative.   Skin: Negative.   Neurological: Negative.   Endo/Heme/Allergies: Negative.   Psychiatric/Behavioral: Negative.       Objective:   Vitals:   03/10/23 1458  BP: 108/62  Pulse: 80  Resp: 16  SpO2: 98%   Height: 5\' 4"  (162.6 cm)  Weight: 112 lb 12.8 oz (51.2 kg)   Physical Exam Constitutional:      Appearance: Diana Friedman is not diaphoretic.  HENT:     Head: Normocephalic.  Right Ear: Tympanic membrane, ear canal and external ear normal.     Left Ear: Tympanic membrane, ear canal and external ear normal.     Nose: Nose normal. No mucosal edema or rhinorrhea.     Mouth/Throat:     Pharynx: Uvula midline. No oropharyngeal exudate.  Eyes:     Conjunctiva/sclera: Conjunctivae normal.  Neck:     Thyroid: No thyromegaly.     Trachea: Trachea normal. No tracheal tenderness or tracheal deviation.  Cardiovascular:     Rate and Rhythm: Normal rate and regular rhythm.     Heart sounds: Normal heart sounds, S1  normal and S2 normal. No murmur heard. Pulmonary:     Effort: No respiratory distress.     Breath sounds: Normal breath sounds. No stridor. No wheezing or rales.  Lymphadenopathy:     Head:     Right side of head: No tonsillar adenopathy.     Left side of head: No tonsillar adenopathy.     Cervical: No cervical adenopathy.  Skin:    Findings: No erythema or rash.     Nails: There is no clubbing.  Neurological:     Mental Status: Diana Friedman is alert.     Diagnostics: Spirometry was performed and demonstrated an FEV1 of 2.85 at 93 % of predicted.  Assessment and Plan:   1. Asthma, severe persistent, well-controlled   2. Perennial allergic rhinitis   3. Seasonal allergic rhinitis due to pollen   4. Other eosinophilia    1.  Allergen avoidance measures - dust mite, cat, dog, pollens, molds  2.  Treat and prevent inflammation:  A. OTC Nasacort - 1 spray each nostril 1-7 times per week B. Breztri - 2 inhalations 1-2 times per day w/ spacer (empty lungs) C. Mepolizumab injections every 4 weeks    3. If needed:  A. AIRSUPRA - 2 inhalations every 4-6 hours (replaces albuterol) B. Antihistamine C. Nasal saline  4.  Return to clinic in 12 months or earlier if problem.    5.  Plan for fall flu vaccine  Benedicta appears to be doing wonderful with her multiorgan eosinophilic driven respiratory tract disease while using Mepolizumab injections.  Diana Friedman has been able to consolidate a lot of the anti-inflammatory medications for her airway and Diana Friedman has not required therapy for an exacerbation of her asthma or an infection of her upper airway.  Diana Friedman will continue on this agent as well as utilize other agents as noted above and we will see her back in this clinic in 1 year or earlier if there is a problem.  Laurette Schimke, MD Allergy / Immunology [facility]

## 2023-03-11 ENCOUNTER — Other Ambulatory Visit (HOSPITAL_COMMUNITY): Payer: Self-pay

## 2023-03-11 ENCOUNTER — Encounter: Payer: Self-pay | Admitting: Allergy and Immunology

## 2023-03-11 NOTE — Telephone Encounter (Signed)
Hey Al Decant, Yes mam they have tried and failed albuterol.

## 2023-03-11 NOTE — Telephone Encounter (Signed)
Has patient failed albuterol?

## 2023-03-11 NOTE — Telephone Encounter (Signed)
Called copay card and patient has only $1400 left on card but her Ins for the last 3 months no copay covered at 100%- patient advised of same

## 2023-03-12 ENCOUNTER — Telehealth: Payer: Self-pay

## 2023-03-12 NOTE — Telephone Encounter (Signed)
*  Asthma/Allergy  PA request received for Airsupra 90-80MCG/ACT aerosol  PA submitted to [facility] and is pending additional questions/determination  *Patient has failed albuterol  Key: [id number]

## 2023-03-12 NOTE — Telephone Encounter (Signed)
Thank you! PA has been submitted and is pending determination. Will be updated in additional encounter created.

## 2023-03-15 NOTE — Telephone Encounter (Signed)
Patient Advocate Encounter  Questions generated, answered, and submitted 

## 2023-03-16 ENCOUNTER — Telehealth: Payer: Self-pay | Admitting: Allergy & Immunology

## 2023-03-16 NOTE — Telephone Encounter (Signed)
I received a call from someone at [employer] AirSupra was denied.   Formulary alternatives are Ventolin, albuterol generic to ProAir, albuterol generic to Proventil, and brand name Symbicort.   Malachi Bonds, MD [facility]

## 2023-03-17 ENCOUNTER — Other Ambulatory Visit: Payer: Self-pay | Admitting: *Deleted

## 2023-03-17 MED ORDER — VENTOLIN HFA 108 (90 BASE) MCG/ACT IN AERS: 2.0000 | INHALATION_SPRAY | RESPIRATORY_TRACT | 1 refills | Status: AC | PRN

## 2023-03-17 NOTE — Telephone Encounter (Signed)
New prescription for Ventolin has been sent in, attempted to call patient, there was no answer and voicemail was full, will need to call again to inform.

## 2023-03-20 NOTE — Telephone Encounter (Signed)
Entered in error. I added onto a different telephone note instead.

## 2023-03-25 ENCOUNTER — Other Ambulatory Visit (HOSPITAL_COMMUNITY): Payer: Self-pay

## 2023-03-29 ENCOUNTER — Other Ambulatory Visit (HOSPITAL_COMMUNITY): Payer: Self-pay

## 2023-03-31 ENCOUNTER — Other Ambulatory Visit (HOSPITAL_COMMUNITY): Payer: Self-pay

## 2023-04-02 ENCOUNTER — Other Ambulatory Visit (HOSPITAL_COMMUNITY): Payer: Self-pay

## 2023-04-27 ENCOUNTER — Other Ambulatory Visit (HOSPITAL_COMMUNITY): Payer: Self-pay

## 2023-05-03 ENCOUNTER — Other Ambulatory Visit (HOSPITAL_COMMUNITY): Payer: Self-pay

## 2023-05-25 ENCOUNTER — Other Ambulatory Visit (HOSPITAL_COMMUNITY): Payer: Self-pay

## 2023-05-28 ENCOUNTER — Other Ambulatory Visit (HOSPITAL_COMMUNITY): Payer: Self-pay

## 2023-06-17 DIAGNOSIS — N898 Other specified noninflammatory disorders of vagina: Secondary | ICD-10-CM | POA: Diagnosis not present

## 2023-06-17 DIAGNOSIS — Z681 Body mass index (BMI) 19 or less, adult: Secondary | ICD-10-CM | POA: Diagnosis not present

## 2023-06-17 DIAGNOSIS — N760 Acute vaginitis: Secondary | ICD-10-CM | POA: Diagnosis not present

## 2023-06-23 ENCOUNTER — Other Ambulatory Visit: Payer: Self-pay

## 2023-06-23 NOTE — Progress Notes (Signed)
Specialty Pharmacy Refill Coordination Note  Diana Friedman is a 22 y.o. female contacted today regarding refills of specialty medication(s) Mepolizumab .  Patient requested Delivery  on 07/01/23  to verified address [address]   [place] [facility] [address]   Medication will be filled on 06/30/23.

## 2023-07-19 ENCOUNTER — Other Ambulatory Visit (HOSPITAL_COMMUNITY): Payer: Self-pay

## 2023-07-21 ENCOUNTER — Other Ambulatory Visit: Payer: Self-pay

## 2023-07-21 NOTE — Progress Notes (Signed)
Specialty Pharmacy Ongoing Clinical Assessment Note  Diana Friedman is a 22 y.o. female who is being followed by the specialty pharmacy service for RxSp Asthma/COPD   Patient's specialty medication(s) reviewed today: Mepolizumab   Missed doses in the last 4 weeks: 0   Patient/Caregiver did not have any additional questions or concerns.   Therapeutic benefit summary: Patient is achieving benefit   Adverse events/side effects summary: No adverse events/side effects   Patient's therapy is appropriate to: Continue    Goals Addressed             This Visit's Progress    Minimize recurrence of flares       Patient is on track. Patient will maintain adherence         Follow up:  6 months  Shaneece Stockburger [facility] Specialty Pharmacist

## 2023-07-21 NOTE — Progress Notes (Signed)
Specialty Pharmacy Refill Coordination Note  Diana Friedman is a 22 y.o. female contacted today regarding refills of specialty medication(s) Mepolizumab   Patient requested Delivery   Delivery date: 07/29/23   Verified address: [address] [place], [place] [address]   Medication will be filled on 07/28/23.

## 2023-08-06 ENCOUNTER — Other Ambulatory Visit (HOSPITAL_COMMUNITY): Payer: Self-pay

## 2023-08-06 ENCOUNTER — Other Ambulatory Visit: Payer: Self-pay

## 2023-08-09 DIAGNOSIS — R636 Underweight: Secondary | ICD-10-CM | POA: Diagnosis not present

## 2023-08-09 DIAGNOSIS — Z0000 Encounter for general adult medical examination without abnormal findings: Secondary | ICD-10-CM | POA: Diagnosis not present

## 2023-08-09 DIAGNOSIS — J45909 Unspecified asthma, uncomplicated: Secondary | ICD-10-CM | POA: Diagnosis not present

## 2023-08-09 DIAGNOSIS — Z01419 Encounter for gynecological examination (general) (routine) without abnormal findings: Secondary | ICD-10-CM | POA: Diagnosis not present

## 2023-08-09 DIAGNOSIS — R41840 Attention and concentration deficit: Secondary | ICD-10-CM | POA: Diagnosis not present

## 2023-08-19 ENCOUNTER — Other Ambulatory Visit: Payer: Self-pay

## 2023-08-19 ENCOUNTER — Other Ambulatory Visit (HOSPITAL_COMMUNITY): Payer: Self-pay

## 2023-08-19 ENCOUNTER — Other Ambulatory Visit: Payer: Self-pay | Admitting: Allergy and Immunology

## 2023-08-19 MED ORDER — NUCALA 100 MG/ML ~~LOC~~ SOAJ
100.0000 mg | SUBCUTANEOUS | 11 refills | Status: DC
  Filled 2023-08-19: qty 1, 28d supply, fill #0
  Filled 2023-09-27: qty 1, 28d supply, fill #1
  Filled 2023-10-25: qty 1, 28d supply, fill #2
  Filled 2023-11-26: qty 1, 28d supply, fill #3
  Filled 2023-12-23: qty 1, 28d supply, fill #4
  Filled 2024-02-02 (×2): qty 1, 28d supply, fill #5
  Filled 2024-02-23: qty 1, 28d supply, fill #6
  Filled 2024-03-27 – 2024-05-24 (×6): qty 1, 28d supply, fill #7
  Filled 2024-06-16: qty 1, 28d supply, fill #8
  Filled 2024-07-18: qty 1, 28d supply, fill #9
  Filled 2024-08-16: qty 1, 28d supply, fill #10

## 2023-08-19 NOTE — Progress Notes (Signed)
Specialty Pharmacy Refill Coordination Note  Diana Friedman is a 22 y.o. female contacted today regarding refills of specialty medication(s) Mepolizumab   Patient requested Delivery   Delivery date: 09/02/23   Verified address: [address] [place] [facility] [address]   Medication will be filled on 09/01/23. *Pending refill request*

## 2023-08-20 ENCOUNTER — Other Ambulatory Visit: Payer: Self-pay

## 2023-09-23 ENCOUNTER — Other Ambulatory Visit (HOSPITAL_COMMUNITY): Payer: Self-pay

## 2023-09-27 ENCOUNTER — Other Ambulatory Visit (HOSPITAL_COMMUNITY): Payer: Self-pay | Admitting: Pharmacy Technician

## 2023-09-27 ENCOUNTER — Other Ambulatory Visit (HOSPITAL_COMMUNITY): Payer: Self-pay

## 2023-09-27 NOTE — Progress Notes (Signed)
Specialty Pharmacy Refill Coordination Note  Arelene Cantor is a 22 y.o. female contacted today regarding refills of specialty medication(s) No data recorded  Patient requested (Patient-Rptd) Delivery   Delivery date: (Patient-Rptd) 09/30/23   Verified address: (Patient-Rptd) [address] [place] [address]   Medication will be filled on 09/30/2023.

## 2023-09-30 ENCOUNTER — Other Ambulatory Visit: Payer: Self-pay

## 2023-10-04 DIAGNOSIS — M654 Radial styloid tenosynovitis [de Quervain]: Secondary | ICD-10-CM | POA: Diagnosis not present

## 2023-10-21 ENCOUNTER — Other Ambulatory Visit: Payer: Self-pay

## 2023-10-25 ENCOUNTER — Encounter (HOSPITAL_COMMUNITY): Payer: Self-pay

## 2023-10-25 ENCOUNTER — Other Ambulatory Visit: Payer: Self-pay | Admitting: Pharmacy Technician

## 2023-10-25 ENCOUNTER — Other Ambulatory Visit (HOSPITAL_COMMUNITY): Payer: Self-pay

## 2023-10-25 NOTE — Progress Notes (Signed)
Specialty Pharmacy Refill Coordination Note  Diana Friedman is a 23 y.o. female contacted today regarding refills of specialty medication(s) Mepolizumab   Patient requested (Patient-Rptd) Delivery   Delivery date: (Patient-Rptd) 11/01/23  11/02/23   Verified address: (Patient-Rptd) [address] [place] [address]   Medication will be filled on 11/01/23.

## 2023-11-22 ENCOUNTER — Other Ambulatory Visit: Payer: Self-pay

## 2023-11-26 ENCOUNTER — Other Ambulatory Visit: Payer: Self-pay

## 2023-11-26 NOTE — Progress Notes (Signed)
 Specialty Pharmacy Refill Coordination Note  Diana Friedman is a 23 y.o. female contacted today regarding refills of specialty medication(s) Mepolizumab [name])   Patient requested (Patient-Rptd) Delivery   Delivery date: (Patient-Rptd) 12/02/23   Verified address: (Patient-Rptd) [address] [place] [address]   Medication will be filled on 03.05.25.

## 2023-12-23 ENCOUNTER — Other Ambulatory Visit: Payer: Self-pay

## 2023-12-23 ENCOUNTER — Other Ambulatory Visit: Payer: Self-pay | Admitting: Pharmacy Technician

## 2023-12-23 NOTE — Progress Notes (Signed)
 Specialty Pharmacy Refill Coordination Note  Diana Friedman is a 23 y.o. female contacted today regarding refills of specialty medication(s) Mepolizumab [name])   Patient requested (Patient-Rptd) Delivery   Delivery date: (Patient-Rptd) 01/04/24   Verified address: (Patient-Rptd) [address] [place] [address]   Medication will be filled on 01/03/24.

## 2024-01-03 ENCOUNTER — Other Ambulatory Visit: Payer: Self-pay

## 2024-01-26 ENCOUNTER — Other Ambulatory Visit: Payer: Self-pay

## 2024-02-02 ENCOUNTER — Other Ambulatory Visit (HOSPITAL_COMMUNITY): Payer: Self-pay

## 2024-02-02 ENCOUNTER — Other Ambulatory Visit: Payer: Self-pay

## 2024-02-02 NOTE — Progress Notes (Signed)
 Specialty Pharmacy Refill Coordination Note  Diana Friedman is a 23 y.o. female contacted today regarding refills of specialty medication(s) Mepolizumab  (Nucala )   Patient requested Delivery   Delivery date: 02/04/24   Verified address: [address]   [name] [facility] [address]   Medication will be filled on 02/03/24.

## 2024-02-03 ENCOUNTER — Other Ambulatory Visit: Payer: Self-pay

## 2024-02-11 ENCOUNTER — Other Ambulatory Visit (HOSPITAL_COMMUNITY): Payer: Self-pay

## 2024-02-23 ENCOUNTER — Other Ambulatory Visit: Payer: Self-pay

## 2024-02-23 NOTE — Progress Notes (Signed)
 Specialty Pharmacy Refill Coordination Note  Diana Friedman is a 23 y.o. female contacted today regarding refills of specialty medication(s) Mepolizumab  (Nucala )   Patient requested Delivery   Delivery date: 02/29/24   Verified address: [address]   [place] [address]   Medication will be filled on 02/28/24.

## 2024-02-23 NOTE — Progress Notes (Signed)
 Specialty Pharmacy Ongoing Clinical Assessment Note  Diana Friedman is a 23 y.o. female who is being followed by the specialty pharmacy service for RxSp Asthma/COPD   Patient's specialty medication(s) reviewed today: Mepolizumab  (Nucala )   Missed doses in the last 4 weeks: 0   Patient/Caregiver did not have any additional questions or concerns.   Therapeutic benefit summary: Patient is achieving benefit   Adverse events/side effects summary: No adverse events/side effects   Patient's therapy is appropriate to: Continue    Goals Addressed             This Visit's Progress    Minimize recurrence of flares   On track    Patient is on track. Patient will maintain adherence         Follow up: 6 months  [facility] Specialty Pharmacist

## 2024-02-28 ENCOUNTER — Other Ambulatory Visit: Payer: Self-pay

## 2024-03-07 ENCOUNTER — Ambulatory Visit: Payer: BC Managed Care – PPO | Admitting: Allergy and Immunology

## 2024-03-27 ENCOUNTER — Other Ambulatory Visit (HOSPITAL_COMMUNITY): Payer: Self-pay

## 2024-03-27 ENCOUNTER — Other Ambulatory Visit: Payer: Self-pay

## 2024-03-27 NOTE — Progress Notes (Signed)
 Benefits Investigation Started  Rejection: Prior Authorization Required  Routed to: Rx Prior Auth Team/ATTN: Juliana

## 2024-03-28 ENCOUNTER — Other Ambulatory Visit: Payer: Self-pay

## 2024-03-29 ENCOUNTER — Other Ambulatory Visit: Payer: Self-pay

## 2024-03-29 ENCOUNTER — Other Ambulatory Visit (HOSPITAL_COMMUNITY): Payer: Self-pay

## 2024-03-30 ENCOUNTER — Other Ambulatory Visit: Payer: Self-pay

## 2024-04-03 ENCOUNTER — Telehealth: Payer: Self-pay

## 2024-04-03 ENCOUNTER — Other Ambulatory Visit: Payer: Self-pay

## 2024-04-03 ENCOUNTER — Other Ambulatory Visit (HOSPITAL_COMMUNITY): Payer: Self-pay

## 2024-04-03 NOTE — Telephone Encounter (Signed)
 Yes she has one 6/10 and didn't keep it

## 2024-04-03 NOTE — Telephone Encounter (Signed)
 Got it, thank you!  *message sent to call center

## 2024-04-03 NOTE — Telephone Encounter (Signed)
 Per call center- PA is needed for Nucala  ( I see that the patient hasn't been seen in 1+ year so I assume they need an office visit?) Thank you!

## 2024-04-03 NOTE — Progress Notes (Signed)
 Per [name]- patient was a no show for re-approval appt 03/07/2024

## 2024-04-12 ENCOUNTER — Other Ambulatory Visit: Payer: Self-pay

## 2024-05-09 ENCOUNTER — Ambulatory Visit (INDEPENDENT_AMBULATORY_CARE_PROVIDER_SITE_OTHER): Admitting: Allergy and Immunology

## 2024-05-09 ENCOUNTER — Other Ambulatory Visit: Payer: Self-pay

## 2024-05-09 VITALS — BP 110/80 | HR 73 | Temp 98.2°F | Resp 20 | Ht 63.0 in | Wt 121.6 lb

## 2024-05-09 DIAGNOSIS — D7219 Other eosinophilia: Secondary | ICD-10-CM | POA: Diagnosis not present

## 2024-05-09 DIAGNOSIS — J301 Allergic rhinitis due to pollen: Secondary | ICD-10-CM | POA: Diagnosis not present

## 2024-05-09 DIAGNOSIS — J4550 Severe persistent asthma, uncomplicated: Secondary | ICD-10-CM

## 2024-05-09 DIAGNOSIS — J3089 Other allergic rhinitis: Secondary | ICD-10-CM | POA: Diagnosis not present

## 2024-05-09 MED ORDER — BREZTRI AEROSPHERE 160-9-4.8 MCG/ACT IN AERO: 2.0000 | INHALATION_SPRAY | Freq: Two times a day (BID) | RESPIRATORY_TRACT | 3 refills | Status: AC

## 2024-05-09 MED ORDER — AIRSUPRA 90-80 MCG/ACT IN AERO: 2.0000 | INHALATION_SPRAY | RESPIRATORY_TRACT | 1 refills | Status: AC | PRN

## 2024-05-09 MED ORDER — BREZTRI AEROSPHERE 160-9-4.8 MCG/ACT IN AERO: 2.0000 | INHALATION_SPRAY | Freq: Two times a day (BID) | RESPIRATORY_TRACT | 5 refills | Status: DC

## 2024-05-09 NOTE — Patient Instructions (Addendum)
  1.  Allergen avoidance measures - dust mite, cat, dog, pollens, molds  2.  Treat and prevent inflammation:  A. Mepolizumab  injections every 4 weeks    3. If needed:  A. AIRSUPRA  - 2 inhalations every 4-6 hours (replaces albuterol ) B. Antihistamine C. Nasal saline  4. Can restart the following if airway flared up:  A. OTC Nasacort  - 1 spray each nostril 1-7 times per week B. Breztri  - 2 inhalations 2 times per day w/ spacer (empty lungs)  5.  Return to clinic in 12 months or earlier if problem.    6.  Influenza = Tamiflu. Covid = Paxlovid

## 2024-05-09 NOTE — Progress Notes (Signed)
 [place] - [place]   Follow-up Note  Referring Provider: Loreli Kins, MD Primary Provider: Loreli Kins, MD Date of Office Visit: 05/09/2024  Subjective:   Diana Friedman (DOB: [birth year]) is a 23 y.o. female who [facility] on 05/09/2024 in re-evaluation of the following:  HPI: Diana Friedman returns to this clinic in evaluation of asthma, allergic rhinitis, chronic sinusitis, eosinophilia, history of reflux.  I last saw him in this clinic 10 March 2023.  She has really done well over the course of the past year while using Mepolizumab  as her only controller agent.  She no longer uses any Breztri  and she no longer uses any Nasacort .  She rarely requires a short acting bronchodilator and she can exercise without any problem.  She has not required a systemic steroid or antibiotic for any type of airway issue.  She has had no issues with reflux and does not require any therapy for this problem.  Allergies as of 05/09/2024       Reactions   Pineapple Nausea And Vomiting   Stomach pain        Medication List    Airsupra  90-80 MCG/ACT Aero Generic drug: Albuterol -Budesonide Inhale 2 Inhalations into the lungs every 4 (four) hours as needed.   albuterol  108 (90 Base) MCG/ACT inhaler Commonly known as: VENTOLIN  HFA Inhale 2 puffs into the lungs every 4 (four) hours as needed for wheezing or shortness of breath.   Ventolin  HFA 108 (90 Base) MCG/ACT inhaler Generic drug: albuterol  Inhale 2 puffs into the lungs every 4 (four) hours as needed for wheezing or shortness of breath.   Breztri  Aerosphere 160-9-4.8 MCG/ACT Aero inhaler Generic drug: budesonide-glycopyrrolate-formoterol Inhale 2 puffs into the lungs in the morning and at bedtime.   levocetirizine 5 MG tablet Commonly known as: XYZAL  Take 1 tablet (5 mg total) by mouth daily as needed for allergies (Can take an extra dose during flare ups.).   Mirena   (52 MG) 20 MCG/DAY Iud Generic drug: levonorgestrel  Take 1 device by intrauterine route.   Nucala  100 MG/ML Soaj Generic drug: Mepolizumab  Inject 1 mL (100 mg total) into the skin every 28 (twenty-eight) days.   spironolactone 100 MG tablet Commonly known as: ALDACTONE Take 100 mg by mouth daily.   triamcinolone  55 MCG/ACT Aero nasal inhaler Commonly known as: NASACORT  Place 1 spray into the nose 2 (two) times daily.    Past Medical History:  Diagnosis Date   Asthma     Past Surgical History:  Procedure Laterality Date   LAPAROSCOPY N/A 10/31/2021   Procedure: OPERATIVE LAPAROSCOPY FOR LEFT OVARIAN TORSION;  Surgeon: Okey Leader, MD;  Location: [facility] OR;  Service: Gynecology;  Laterality: N/A;    Review of systems negative except as noted in HPI / PMHx or noted below:  Review of Systems  Constitutional: Negative.   HENT: Negative.    Eyes: Negative.   Respiratory: Negative.    Cardiovascular: Negative.   Gastrointestinal: Negative.   Genitourinary: Negative.   Musculoskeletal: Negative.   Skin: Negative.   Neurological: Negative.   Endo/Heme/Allergies: Negative.   Psychiatric/Behavioral: Negative.       Objective:   Vitals:   05/09/24 1037  BP: 110/80  Pulse: 73  Resp: 20  Temp: 98.2 F (36.8 C)  SpO2: 100%   Height: 5' 3 (160 cm)  Weight: 121 lb 9.6 oz (55.2 kg)   Physical Exam Constitutional:      Appearance: She is  not diaphoretic.  HENT:     Head: Normocephalic.     Right Ear: Tympanic membrane, ear canal and external ear normal.     Left Ear: Tympanic membrane, ear canal and external ear normal.     Nose: Nose normal. No mucosal edema or rhinorrhea.     Mouth/Throat:     Pharynx: Uvula midline. No oropharyngeal exudate.  Eyes:     Conjunctiva/sclera: Conjunctivae normal.  Neck:     Thyroid: No thyromegaly.     Trachea: Trachea normal. No tracheal tenderness or tracheal deviation.  Cardiovascular:     Rate and Rhythm: Normal rate and  regular rhythm.     Heart sounds: Normal heart sounds, S1 normal and S2 normal. No murmur heard. Pulmonary:     Effort: No respiratory distress.     Breath sounds: Normal breath sounds. No stridor. No wheezing or rales.  Lymphadenopathy:     Head:     Right side of head: No tonsillar adenopathy.     Left side of head: No tonsillar adenopathy.     Cervical: No cervical adenopathy.  Skin:    Findings: No erythema or rash.     Nails: There is no clubbing.  Neurological:     Mental Status: She is alert.     Diagnostics: Spirometry was performed and demonstrated an FEV1 of 2.58 at 84 % of predicted.  The patient had an Asthma Control Test with the following results: ACT Total Score: 21.    Assessment and Plan:   1. Asthma, severe persistent, well-controlled   2. Perennial allergic rhinitis   3. Seasonal allergic rhinitis due to pollen   4. Other eosinophilia    1.  Allergen avoidance measures - dust mite, cat, dog, pollens, molds  2.  Treat and prevent inflammation:  A. Mepolizumab  injections every 4 weeks    3. If needed:  A. AIRSUPRA  - 2 inhalations every 4-6 hours (replaces albuterol ) B. Antihistamine C. Nasal saline  4. Can restart the following if airway flared up:  A. OTC Nasacort  - 1 spray each nostril 1-7 times per week B. Breztri  - 2 inhalations 2 times per day w/ spacer (empty lungs)  5.  Return to clinic in 12 months or earlier if problem.    6.  Influenza = Tamiflu. 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  Assuming she continues to do well with this plan I will see her back in this clinic in 1 year or earlier if there is a problem.  Camellia Denis, MD Allergy / Immunology [facility]

## 2024-05-10 ENCOUNTER — Encounter: Payer: Self-pay | Admitting: Allergy and Immunology

## 2024-05-11 ENCOUNTER — Other Ambulatory Visit: Payer: Self-pay

## 2024-05-24 ENCOUNTER — Other Ambulatory Visit: Payer: Self-pay

## 2024-05-24 ENCOUNTER — Other Ambulatory Visit (HOSPITAL_COMMUNITY): Payer: Self-pay

## 2024-05-24 NOTE — Progress Notes (Signed)
 Specialty Pharmacy Refill Coordination Note  Diana Friedman is a 23 y.o. female contacted today regarding refills of specialty medication(s) Mepolizumab  (Nucala )   Patient requested Delivery   Delivery date: 05/25/24   Verified address: [address]   [name]   Medication will be filled on 05/24/24.

## 2024-06-16 ENCOUNTER — Other Ambulatory Visit: Payer: Self-pay

## 2024-06-16 ENCOUNTER — Other Ambulatory Visit (HOSPITAL_COMMUNITY): Payer: Self-pay

## 2024-06-16 NOTE — Progress Notes (Signed)
 Specialty Pharmacy Refill Coordination Note  Diana Friedman is a 23 y.o. female contacted today regarding refills of specialty medication(s) Mepolizumab  (Nucala )   Patient requested Delivery   Delivery date: 06/22/24   Verified address: [address]   [place] [address]   Medication will be filled on 06/21/24.

## 2024-06-21 ENCOUNTER — Other Ambulatory Visit: Payer: Self-pay

## 2024-06-22 DIAGNOSIS — N7689 Other specified inflammation of vagina and vulva: Secondary | ICD-10-CM | POA: Diagnosis not present

## 2024-07-14 ENCOUNTER — Other Ambulatory Visit: Payer: Self-pay

## 2024-07-18 ENCOUNTER — Other Ambulatory Visit (HOSPITAL_COMMUNITY): Payer: Self-pay

## 2024-07-20 ENCOUNTER — Other Ambulatory Visit (HOSPITAL_COMMUNITY): Payer: Self-pay

## 2024-07-20 ENCOUNTER — Other Ambulatory Visit: Payer: Self-pay

## 2024-07-20 NOTE — Progress Notes (Signed)
 Specialty Pharmacy Refill Coordination Note  Diana Friedman is a 23 y.o. female contacted today regarding refills of specialty medication(s) Mepolizumab  (Nucala )   Patient requested Delivery   Delivery date: 07/27/24   Verified address: [address]   [place] [address]   Medication will be filled on 07/26/24.

## 2024-07-26 ENCOUNTER — Other Ambulatory Visit: Payer: Self-pay

## 2024-08-10 DIAGNOSIS — R41840 Attention and concentration deficit: Secondary | ICD-10-CM | POA: Diagnosis not present

## 2024-08-10 DIAGNOSIS — R946 Abnormal results of thyroid function studies: Secondary | ICD-10-CM | POA: Diagnosis not present

## 2024-08-10 DIAGNOSIS — J45909 Unspecified asthma, uncomplicated: Secondary | ICD-10-CM | POA: Diagnosis not present

## 2024-08-10 DIAGNOSIS — Z0000 Encounter for general adult medical examination without abnormal findings: Secondary | ICD-10-CM | POA: Diagnosis not present

## 2024-08-16 ENCOUNTER — Other Ambulatory Visit: Payer: Self-pay

## 2024-08-18 ENCOUNTER — Other Ambulatory Visit: Payer: Self-pay

## 2024-08-18 ENCOUNTER — Other Ambulatory Visit (HOSPITAL_COMMUNITY): Payer: Self-pay

## 2024-08-18 NOTE — Progress Notes (Signed)
 Specialty Pharmacy Refill Coordination Note  Diana Friedman is a 23 y.o. female contacted today regarding refills of specialty medication(s) Mepolizumab  (Nucala )   Patient requested Delivery   Delivery date: 08/29/24   Verified address: [address]   [place]  [address]   Medication will be filled on: 08/28/24

## 2024-08-21 DIAGNOSIS — L918 Other hypertrophic disorders of the skin: Secondary | ICD-10-CM | POA: Diagnosis not present

## 2024-08-21 DIAGNOSIS — D485 Neoplasm of uncertain behavior of skin: Secondary | ICD-10-CM | POA: Diagnosis not present

## 2024-08-28 ENCOUNTER — Other Ambulatory Visit: Payer: Self-pay | Admitting: Allergy and Immunology

## 2024-08-28 ENCOUNTER — Other Ambulatory Visit: Payer: Self-pay

## 2024-08-28 ENCOUNTER — Other Ambulatory Visit (HOSPITAL_COMMUNITY): Payer: Self-pay

## 2024-08-28 MED ORDER — NUCALA 100 MG/ML ~~LOC~~ SOAJ
100.0000 mg | SUBCUTANEOUS | 11 refills | Status: AC
  Filled 2024-08-28: qty 1, 28d supply, fill #0
  Filled 2024-09-20 – 2024-10-27 (×4): qty 1, 28d supply, fill #1

## 2024-09-18 ENCOUNTER — Other Ambulatory Visit (HOSPITAL_COMMUNITY): Payer: Self-pay

## 2024-09-20 ENCOUNTER — Other Ambulatory Visit: Payer: Self-pay

## 2024-09-22 ENCOUNTER — Other Ambulatory Visit (HOSPITAL_COMMUNITY): Payer: Self-pay

## 2024-09-26 ENCOUNTER — Other Ambulatory Visit (HOSPITAL_COMMUNITY): Payer: Self-pay

## 2024-09-26 ENCOUNTER — Other Ambulatory Visit: Payer: Self-pay

## 2024-09-26 NOTE — Progress Notes (Signed)
 Specialty Pharmacy Refill Coordination Note  Diana Friedman is a 23 y.o. female contacted today regarding refills of specialty medication(s) Mepolizumab  (Nucala )   Patient requested [name] at [facility] [place] date: 09/27/24   Medication will be filled on: 09/26/24

## 2024-10-12 ENCOUNTER — Other Ambulatory Visit (HOSPITAL_COMMUNITY): Payer: Self-pay

## 2024-10-17 ENCOUNTER — Other Ambulatory Visit (HOSPITAL_COMMUNITY): Payer: Self-pay

## 2024-10-17 ENCOUNTER — Other Ambulatory Visit: Payer: Self-pay

## 2024-10-24 ENCOUNTER — Ambulatory Visit: Admitting: Allergy and Immunology

## 2024-10-27 ENCOUNTER — Other Ambulatory Visit: Payer: Self-pay

## 2024-10-27 ENCOUNTER — Other Ambulatory Visit (HOSPITAL_COMMUNITY): Payer: Self-pay

## 2024-10-27 NOTE — Progress Notes (Signed)
 Specialty Pharmacy Refill Coordination Note  Diana Friedman is a 24 y.o. female contacted today regarding refills of specialty medication(s) Mepolizumab  (Nucala )   Patient requested Delivery   Delivery date: 10/31/24   Verified address: [address]   [place] [facility] [address]   Medication will be filled on: 10/30/24

## 2024-10-27 NOTE — Progress Notes (Signed)
 Patient aware of Nucala  CC on file, refill scheduled

## 2024-10-30 ENCOUNTER — Other Ambulatory Visit: Payer: Self-pay

## 2025-05-08 ENCOUNTER — Ambulatory Visit: Admitting: Allergy and Immunology
